# Patient Record
Sex: Male | Born: 1955 | Race: White | Hispanic: No | Marital: Married | Smoking: Current some day smoker
Health system: Southern US, Community
[De-identification: ages and names within clinical notes are randomized; demographics above are authoritative.]

## PROBLEM LIST (undated history)

## (undated) DIAGNOSIS — C3490 Malignant neoplasm of unspecified part of unspecified bronchus or lung: Secondary | ICD-10-CM

## (undated) DIAGNOSIS — Z8701 Personal history of pneumonia (recurrent): Secondary | ICD-10-CM

## (undated) DIAGNOSIS — J90 Pleural effusion, not elsewhere classified: Secondary | ICD-10-CM

## (undated) DIAGNOSIS — J449 Chronic obstructive pulmonary disease, unspecified: Secondary | ICD-10-CM

## (undated) DIAGNOSIS — Z8709 Personal history of other diseases of the respiratory system: Secondary | ICD-10-CM

## (undated) DIAGNOSIS — G8929 Other chronic pain: Secondary | ICD-10-CM

## (undated) DIAGNOSIS — Z902 Acquired absence of lung [part of]: Secondary | ICD-10-CM

## (undated) DIAGNOSIS — I3139 Other pericardial effusion (noninflammatory): Secondary | ICD-10-CM

## (undated) DIAGNOSIS — F419 Anxiety disorder, unspecified: Secondary | ICD-10-CM

## (undated) DIAGNOSIS — I313 Pericardial effusion (noninflammatory): Secondary | ICD-10-CM

## (undated) HISTORY — DX: Personal history of other diseases of the respiratory system: Z87.09

## (undated) HISTORY — DX: Personal history of pneumonia (recurrent): Z87.01

## (undated) HISTORY — DX: Pericardial effusion (noninflammatory): I31.3

## (undated) HISTORY — DX: Pleural effusion, not elsewhere classified: J90

## (undated) HISTORY — DX: Other pericardial effusion (noninflammatory): I31.39

## (undated) HISTORY — DX: Other chronic pain: G89.29

## (undated) HISTORY — DX: Anxiety disorder, unspecified: F41.9

## (undated) HISTORY — PX: OTHER SURGICAL HISTORY: SHX169

---

## 1994-01-25 HISTORY — PX: OTHER SURGICAL HISTORY: SHX169

## 1995-01-26 DIAGNOSIS — M5030 Other cervical disc degeneration, unspecified cervical region: Secondary | ICD-10-CM | POA: Insufficient documentation

## 1995-01-26 HISTORY — PX: OTHER SURGICAL HISTORY: SHX169

## 1998-01-04 ENCOUNTER — Emergency Department (HOSPITAL_COMMUNITY): Admission: EM | Admit: 1998-01-04 | Discharge: 1998-01-04 | Payer: Self-pay | Admitting: Emergency Medicine

## 1998-07-17 ENCOUNTER — Emergency Department (HOSPITAL_COMMUNITY): Admission: EM | Admit: 1998-07-17 | Discharge: 1998-07-17 | Payer: Self-pay

## 1998-08-31 ENCOUNTER — Encounter: Payer: Self-pay | Admitting: Emergency Medicine

## 1998-08-31 ENCOUNTER — Emergency Department (HOSPITAL_COMMUNITY): Admission: EM | Admit: 1998-08-31 | Discharge: 1998-08-31 | Payer: Self-pay | Admitting: Emergency Medicine

## 1998-09-06 ENCOUNTER — Emergency Department (HOSPITAL_COMMUNITY): Admission: EM | Admit: 1998-09-06 | Discharge: 1998-09-06 | Payer: Self-pay | Admitting: Emergency Medicine

## 1999-02-26 ENCOUNTER — Emergency Department (HOSPITAL_COMMUNITY): Admission: EM | Admit: 1999-02-26 | Discharge: 1999-02-26 | Payer: Self-pay | Admitting: Emergency Medicine

## 1999-02-26 ENCOUNTER — Encounter: Payer: Self-pay | Admitting: Emergency Medicine

## 1999-03-04 ENCOUNTER — Emergency Department (HOSPITAL_COMMUNITY): Admission: EM | Admit: 1999-03-04 | Discharge: 1999-03-04 | Payer: Self-pay | Admitting: Emergency Medicine

## 1999-03-04 ENCOUNTER — Encounter: Payer: Self-pay | Admitting: Emergency Medicine

## 2001-06-21 ENCOUNTER — Emergency Department (HOSPITAL_COMMUNITY): Admission: EM | Admit: 2001-06-21 | Discharge: 2001-06-22 | Payer: Self-pay | Admitting: Emergency Medicine

## 2001-06-22 ENCOUNTER — Encounter: Payer: Self-pay | Admitting: Emergency Medicine

## 2001-07-12 ENCOUNTER — Ambulatory Visit (HOSPITAL_BASED_OUTPATIENT_CLINIC_OR_DEPARTMENT_OTHER): Admission: RE | Admit: 2001-07-12 | Discharge: 2001-07-12 | Payer: Self-pay | Admitting: Orthopedic Surgery

## 2002-06-26 ENCOUNTER — Emergency Department (HOSPITAL_COMMUNITY): Admission: EM | Admit: 2002-06-26 | Discharge: 2002-06-27 | Payer: Self-pay | Admitting: Emergency Medicine

## 2002-06-26 ENCOUNTER — Encounter: Payer: Self-pay | Admitting: Emergency Medicine

## 2003-09-19 ENCOUNTER — Ambulatory Visit (HOSPITAL_COMMUNITY): Admission: RE | Admit: 2003-09-19 | Discharge: 2003-09-19 | Payer: Self-pay | Admitting: Internal Medicine

## 2003-10-20 ENCOUNTER — Ambulatory Visit (HOSPITAL_COMMUNITY): Admission: RE | Admit: 2003-10-20 | Discharge: 2003-10-20 | Payer: Self-pay

## 2005-10-18 ENCOUNTER — Emergency Department (HOSPITAL_COMMUNITY): Admission: EM | Admit: 2005-10-18 | Discharge: 2005-10-18 | Payer: Self-pay | Admitting: Emergency Medicine

## 2006-04-22 DIAGNOSIS — G5620 Lesion of ulnar nerve, unspecified upper limb: Secondary | ICD-10-CM

## 2006-04-22 DIAGNOSIS — G5600 Carpal tunnel syndrome, unspecified upper limb: Secondary | ICD-10-CM

## 2006-05-03 ENCOUNTER — Emergency Department (HOSPITAL_COMMUNITY): Admission: EM | Admit: 2006-05-03 | Discharge: 2006-05-04 | Payer: Self-pay | Admitting: Emergency Medicine

## 2006-05-04 DIAGNOSIS — E278 Other specified disorders of adrenal gland: Secondary | ICD-10-CM | POA: Insufficient documentation

## 2006-06-01 ENCOUNTER — Encounter: Admission: RE | Admit: 2006-06-01 | Discharge: 2006-06-01 | Payer: Self-pay | Admitting: Orthopaedic Surgery

## 2006-07-05 ENCOUNTER — Ambulatory Visit: Payer: Self-pay | Admitting: Internal Medicine

## 2006-07-12 ENCOUNTER — Ambulatory Visit (HOSPITAL_COMMUNITY): Admission: RE | Admit: 2006-07-12 | Discharge: 2006-07-12 | Payer: Self-pay | Admitting: Orthopaedic Surgery

## 2006-08-04 ENCOUNTER — Ambulatory Visit (HOSPITAL_COMMUNITY): Admission: RE | Admit: 2006-08-04 | Discharge: 2006-08-04 | Payer: Self-pay | Admitting: Internal Medicine

## 2006-08-04 DIAGNOSIS — J4489 Other specified chronic obstructive pulmonary disease: Secondary | ICD-10-CM | POA: Insufficient documentation

## 2006-08-04 DIAGNOSIS — J449 Chronic obstructive pulmonary disease, unspecified: Secondary | ICD-10-CM

## 2006-08-09 ENCOUNTER — Ambulatory Visit: Payer: Self-pay | Admitting: Internal Medicine

## 2006-08-11 ENCOUNTER — Ambulatory Visit (HOSPITAL_COMMUNITY): Admission: RE | Admit: 2006-08-11 | Discharge: 2006-08-11 | Payer: Self-pay | Admitting: Internal Medicine

## 2006-08-11 DIAGNOSIS — F17200 Nicotine dependence, unspecified, uncomplicated: Secondary | ICD-10-CM

## 2006-08-11 DIAGNOSIS — M545 Low back pain: Secondary | ICD-10-CM

## 2006-08-12 ENCOUNTER — Ambulatory Visit: Payer: Self-pay | Admitting: Internal Medicine

## 2006-08-12 LAB — CONVERTED CEMR LAB
Catecholamines Tot(E+NE) 24 Hr U: 0.043 mg/24hr
Creatinine 24 HR UR: 1635 mg/24hr (ref 800–2000)
Creatinine, Urine: 113.5 mg/dL
Dopamine 24 Hr Urine: 187 mcg/24hr (ref <500)
Epinephrine 24 Hr Urine: 4 mcg/24hr (ref <20)
Norepinephrine 24 Hr Urine: 39 mcg/24hr (ref <80)

## 2006-08-16 ENCOUNTER — Ambulatory Visit: Payer: Self-pay | Admitting: Thoracic Surgery (Cardiothoracic Vascular Surgery)

## 2006-08-25 ENCOUNTER — Encounter: Payer: Self-pay | Admitting: Thoracic Surgery (Cardiothoracic Vascular Surgery)

## 2006-08-25 ENCOUNTER — Inpatient Hospital Stay (HOSPITAL_COMMUNITY)
Admission: RE | Admit: 2006-08-25 | Discharge: 2006-08-31 | Payer: Self-pay | Admitting: Thoracic Surgery (Cardiothoracic Vascular Surgery)

## 2006-08-25 ENCOUNTER — Ambulatory Visit: Payer: Self-pay | Admitting: Thoracic Surgery (Cardiothoracic Vascular Surgery)

## 2006-08-25 HISTORY — PX: OTHER SURGICAL HISTORY: SHX169

## 2006-09-06 ENCOUNTER — Ambulatory Visit: Payer: Self-pay | Admitting: Thoracic Surgery (Cardiothoracic Vascular Surgery)

## 2006-09-09 ENCOUNTER — Encounter (INDEPENDENT_AMBULATORY_CARE_PROVIDER_SITE_OTHER): Payer: Self-pay | Admitting: Internal Medicine

## 2006-09-09 LAB — CONVERTED CEMR LAB
Metanephrines, Ur: 149 (ref 26–230)
Normetanephrine, 24H Ur: 273 (ref 44–540)

## 2006-09-22 ENCOUNTER — Telehealth (INDEPENDENT_AMBULATORY_CARE_PROVIDER_SITE_OTHER): Payer: Self-pay | Admitting: Internal Medicine

## 2006-09-22 ENCOUNTER — Ambulatory Visit: Payer: Self-pay | Admitting: Internal Medicine

## 2006-09-29 ENCOUNTER — Encounter
Admission: RE | Admit: 2006-09-29 | Discharge: 2006-09-29 | Payer: Self-pay | Admitting: Thoracic Surgery (Cardiothoracic Vascular Surgery)

## 2006-09-29 ENCOUNTER — Ambulatory Visit: Payer: Self-pay | Admitting: Thoracic Surgery (Cardiothoracic Vascular Surgery)

## 2006-10-18 ENCOUNTER — Ambulatory Visit: Payer: Self-pay | Admitting: Internal Medicine

## 2006-10-18 DIAGNOSIS — R1011 Right upper quadrant pain: Secondary | ICD-10-CM | POA: Insufficient documentation

## 2006-11-22 ENCOUNTER — Ambulatory Visit: Payer: Self-pay | Admitting: Internal Medicine

## 2006-11-22 DIAGNOSIS — K5900 Constipation, unspecified: Secondary | ICD-10-CM | POA: Insufficient documentation

## 2006-11-22 DIAGNOSIS — R071 Chest pain on breathing: Secondary | ICD-10-CM

## 2006-12-27 ENCOUNTER — Encounter
Admission: RE | Admit: 2006-12-27 | Discharge: 2006-12-27 | Payer: Self-pay | Admitting: Thoracic Surgery (Cardiothoracic Vascular Surgery)

## 2006-12-27 ENCOUNTER — Ambulatory Visit: Payer: Self-pay | Admitting: Thoracic Surgery (Cardiothoracic Vascular Surgery)

## 2007-02-08 ENCOUNTER — Encounter (INDEPENDENT_AMBULATORY_CARE_PROVIDER_SITE_OTHER): Payer: Self-pay | Admitting: Internal Medicine

## 2007-02-28 ENCOUNTER — Ambulatory Visit: Payer: Self-pay | Admitting: Internal Medicine

## 2007-02-28 DIAGNOSIS — J069 Acute upper respiratory infection, unspecified: Secondary | ICD-10-CM | POA: Insufficient documentation

## 2007-03-20 ENCOUNTER — Ambulatory Visit: Payer: Self-pay | Admitting: Thoracic Surgery (Cardiothoracic Vascular Surgery)

## 2007-03-20 ENCOUNTER — Encounter
Admission: RE | Admit: 2007-03-20 | Discharge: 2007-03-20 | Payer: Self-pay | Admitting: Thoracic Surgery (Cardiothoracic Vascular Surgery)

## 2007-05-25 ENCOUNTER — Ambulatory Visit: Payer: Self-pay | Admitting: Internal Medicine

## 2007-05-25 ENCOUNTER — Inpatient Hospital Stay (HOSPITAL_COMMUNITY): Admission: EM | Admit: 2007-05-25 | Discharge: 2007-05-26 | Payer: Self-pay | Admitting: Emergency Medicine

## 2007-05-25 ENCOUNTER — Encounter: Payer: Self-pay | Admitting: Internal Medicine

## 2007-05-26 ENCOUNTER — Encounter (INDEPENDENT_AMBULATORY_CARE_PROVIDER_SITE_OTHER): Payer: Self-pay | Admitting: Internal Medicine

## 2007-05-30 ENCOUNTER — Telehealth (INDEPENDENT_AMBULATORY_CARE_PROVIDER_SITE_OTHER): Payer: Self-pay | Admitting: Internal Medicine

## 2007-05-30 ENCOUNTER — Inpatient Hospital Stay (HOSPITAL_COMMUNITY): Admission: EM | Admit: 2007-05-30 | Discharge: 2007-06-05 | Payer: Self-pay | Admitting: Emergency Medicine

## 2007-05-30 ENCOUNTER — Ambulatory Visit: Payer: Self-pay | Admitting: Internal Medicine

## 2007-05-30 ENCOUNTER — Ambulatory Visit: Payer: Self-pay | Admitting: Cardiology

## 2007-05-30 DIAGNOSIS — J189 Pneumonia, unspecified organism: Secondary | ICD-10-CM

## 2007-05-30 DIAGNOSIS — R1013 Epigastric pain: Secondary | ICD-10-CM | POA: Insufficient documentation

## 2007-05-31 ENCOUNTER — Ambulatory Visit: Payer: Self-pay | Admitting: Vascular Surgery

## 2007-05-31 ENCOUNTER — Encounter (INDEPENDENT_AMBULATORY_CARE_PROVIDER_SITE_OTHER): Payer: Self-pay | Admitting: *Deleted

## 2007-05-31 ENCOUNTER — Encounter (INDEPENDENT_AMBULATORY_CARE_PROVIDER_SITE_OTHER): Payer: Self-pay | Admitting: Internal Medicine

## 2007-06-09 ENCOUNTER — Ambulatory Visit: Payer: Self-pay | Admitting: Internal Medicine

## 2007-06-09 DIAGNOSIS — N401 Enlarged prostate with lower urinary tract symptoms: Secondary | ICD-10-CM | POA: Insufficient documentation

## 2007-06-09 DIAGNOSIS — R945 Abnormal results of liver function studies: Secondary | ICD-10-CM | POA: Insufficient documentation

## 2007-06-12 LAB — CONVERTED CEMR LAB
AST: 43 units/L — ABNORMAL HIGH (ref 0–37)
Alkaline Phosphatase: 134 units/L — ABNORMAL HIGH (ref 39–117)
BUN: 21 mg/dL (ref 6–23)
Calcium: 8.6 mg/dL (ref 8.4–10.5)
Chloride: 105 meq/L (ref 96–112)
Creatinine, Ser: 1.3 mg/dL (ref 0.40–1.50)

## 2007-06-13 ENCOUNTER — Telehealth (INDEPENDENT_AMBULATORY_CARE_PROVIDER_SITE_OTHER): Payer: Self-pay | Admitting: Internal Medicine

## 2007-06-27 ENCOUNTER — Ambulatory Visit: Payer: Self-pay | Admitting: Internal Medicine

## 2007-06-30 LAB — CONVERTED CEMR LAB
Alkaline Phosphatase: 94 units/L (ref 39–117)
Bilirubin, Direct: 0.1 mg/dL (ref 0.0–0.3)
Indirect Bilirubin: 0.2 mg/dL (ref 0.0–0.9)
Total Protein: 7.5 g/dL (ref 6.0–8.3)

## 2007-07-05 ENCOUNTER — Encounter: Admission: RE | Admit: 2007-07-05 | Discharge: 2007-07-05 | Payer: Self-pay | Admitting: Orthopaedic Surgery

## 2007-07-21 ENCOUNTER — Ambulatory Visit: Payer: Self-pay | Admitting: Thoracic Surgery (Cardiothoracic Vascular Surgery)

## 2007-07-31 ENCOUNTER — Ambulatory Visit (HOSPITAL_COMMUNITY): Admission: RE | Admit: 2007-07-31 | Discharge: 2007-07-31 | Payer: Self-pay | Admitting: Family Medicine

## 2007-07-31 ENCOUNTER — Ambulatory Visit: Payer: Self-pay | Admitting: Family Medicine

## 2007-08-01 ENCOUNTER — Telehealth (INDEPENDENT_AMBULATORY_CARE_PROVIDER_SITE_OTHER): Payer: Self-pay | Admitting: Internal Medicine

## 2007-08-18 ENCOUNTER — Telehealth (INDEPENDENT_AMBULATORY_CARE_PROVIDER_SITE_OTHER): Payer: Self-pay | Admitting: Internal Medicine

## 2007-08-25 ENCOUNTER — Ambulatory Visit (HOSPITAL_COMMUNITY)
Admission: RE | Admit: 2007-08-25 | Discharge: 2007-08-25 | Payer: Self-pay | Admitting: Thoracic Surgery (Cardiothoracic Vascular Surgery)

## 2007-08-28 ENCOUNTER — Telehealth (INDEPENDENT_AMBULATORY_CARE_PROVIDER_SITE_OTHER): Payer: Self-pay | Admitting: Internal Medicine

## 2007-08-28 ENCOUNTER — Ambulatory Visit: Payer: Self-pay | Admitting: Thoracic Surgery (Cardiothoracic Vascular Surgery)

## 2007-08-31 ENCOUNTER — Encounter (INDEPENDENT_AMBULATORY_CARE_PROVIDER_SITE_OTHER): Payer: Self-pay | Admitting: Internal Medicine

## 2007-09-05 ENCOUNTER — Telehealth (INDEPENDENT_AMBULATORY_CARE_PROVIDER_SITE_OTHER): Payer: Self-pay | Admitting: Internal Medicine

## 2007-09-11 ENCOUNTER — Encounter (INDEPENDENT_AMBULATORY_CARE_PROVIDER_SITE_OTHER): Payer: Self-pay | Admitting: Internal Medicine

## 2007-09-19 ENCOUNTER — Encounter (INDEPENDENT_AMBULATORY_CARE_PROVIDER_SITE_OTHER): Payer: Self-pay | Admitting: Internal Medicine

## 2007-09-26 ENCOUNTER — Telehealth (INDEPENDENT_AMBULATORY_CARE_PROVIDER_SITE_OTHER): Payer: Self-pay | Admitting: Internal Medicine

## 2007-10-03 ENCOUNTER — Encounter (INDEPENDENT_AMBULATORY_CARE_PROVIDER_SITE_OTHER): Payer: Self-pay | Admitting: Internal Medicine

## 2007-10-19 ENCOUNTER — Telehealth (INDEPENDENT_AMBULATORY_CARE_PROVIDER_SITE_OTHER): Payer: Self-pay | Admitting: *Deleted

## 2007-10-31 ENCOUNTER — Ambulatory Visit: Payer: Self-pay | Admitting: Internal Medicine

## 2007-10-31 DIAGNOSIS — M5412 Radiculopathy, cervical region: Secondary | ICD-10-CM | POA: Insufficient documentation

## 2007-11-14 ENCOUNTER — Telehealth (INDEPENDENT_AMBULATORY_CARE_PROVIDER_SITE_OTHER): Payer: Self-pay | Admitting: Internal Medicine

## 2007-11-14 ENCOUNTER — Ambulatory Visit: Payer: Self-pay | Admitting: Internal Medicine

## 2007-11-14 DIAGNOSIS — J209 Acute bronchitis, unspecified: Secondary | ICD-10-CM

## 2007-11-15 ENCOUNTER — Encounter (INDEPENDENT_AMBULATORY_CARE_PROVIDER_SITE_OTHER): Payer: Self-pay | Admitting: Internal Medicine

## 2007-11-17 ENCOUNTER — Ambulatory Visit: Payer: Self-pay | Admitting: Nurse Practitioner

## 2007-11-17 DIAGNOSIS — J441 Chronic obstructive pulmonary disease with (acute) exacerbation: Secondary | ICD-10-CM | POA: Insufficient documentation

## 2007-11-28 ENCOUNTER — Ambulatory Visit: Payer: Self-pay | Admitting: Internal Medicine

## 2007-12-08 ENCOUNTER — Ambulatory Visit: Payer: Self-pay | Admitting: Thoracic Surgery (Cardiothoracic Vascular Surgery)

## 2007-12-08 ENCOUNTER — Encounter
Admission: RE | Admit: 2007-12-08 | Discharge: 2007-12-08 | Payer: Self-pay | Admitting: Thoracic Surgery (Cardiothoracic Vascular Surgery)

## 2008-01-11 ENCOUNTER — Telehealth (INDEPENDENT_AMBULATORY_CARE_PROVIDER_SITE_OTHER): Payer: Self-pay | Admitting: Internal Medicine

## 2008-01-12 ENCOUNTER — Ambulatory Visit: Payer: Self-pay | Admitting: Internal Medicine

## 2008-02-07 ENCOUNTER — Encounter (INDEPENDENT_AMBULATORY_CARE_PROVIDER_SITE_OTHER): Payer: Self-pay | Admitting: Internal Medicine

## 2008-02-20 ENCOUNTER — Encounter (INDEPENDENT_AMBULATORY_CARE_PROVIDER_SITE_OTHER): Payer: Self-pay | Admitting: Internal Medicine

## 2008-03-04 ENCOUNTER — Encounter (INDEPENDENT_AMBULATORY_CARE_PROVIDER_SITE_OTHER): Payer: Self-pay | Admitting: Internal Medicine

## 2008-03-12 ENCOUNTER — Encounter
Admission: RE | Admit: 2008-03-12 | Discharge: 2008-03-12 | Payer: Self-pay | Admitting: Thoracic Surgery (Cardiothoracic Vascular Surgery)

## 2008-03-12 ENCOUNTER — Ambulatory Visit: Payer: Self-pay | Admitting: Thoracic Surgery (Cardiothoracic Vascular Surgery)

## 2008-03-27 ENCOUNTER — Telehealth (INDEPENDENT_AMBULATORY_CARE_PROVIDER_SITE_OTHER): Payer: Self-pay | Admitting: Internal Medicine

## 2008-04-08 ENCOUNTER — Encounter (INDEPENDENT_AMBULATORY_CARE_PROVIDER_SITE_OTHER): Payer: Self-pay | Admitting: Internal Medicine

## 2008-04-16 ENCOUNTER — Encounter (INDEPENDENT_AMBULATORY_CARE_PROVIDER_SITE_OTHER): Payer: Self-pay | Admitting: Internal Medicine

## 2008-05-15 ENCOUNTER — Encounter (INDEPENDENT_AMBULATORY_CARE_PROVIDER_SITE_OTHER): Payer: Self-pay | Admitting: Internal Medicine

## 2008-06-27 ENCOUNTER — Encounter (INDEPENDENT_AMBULATORY_CARE_PROVIDER_SITE_OTHER): Payer: Self-pay | Admitting: Internal Medicine

## 2008-08-22 ENCOUNTER — Encounter: Admission: RE | Admit: 2008-08-22 | Discharge: 2008-08-22 | Payer: Self-pay | Admitting: *Deleted

## 2008-08-22 ENCOUNTER — Ambulatory Visit: Payer: Self-pay | Admitting: Thoracic Surgery (Cardiothoracic Vascular Surgery)

## 2008-10-12 ENCOUNTER — Encounter (INDEPENDENT_AMBULATORY_CARE_PROVIDER_SITE_OTHER): Payer: Self-pay | Admitting: Internal Medicine

## 2008-11-11 ENCOUNTER — Ambulatory Visit: Payer: Self-pay | Admitting: Physician Assistant

## 2008-11-11 DIAGNOSIS — J029 Acute pharyngitis, unspecified: Secondary | ICD-10-CM

## 2008-11-22 ENCOUNTER — Ambulatory Visit: Payer: Self-pay | Admitting: Internal Medicine

## 2008-11-22 DIAGNOSIS — Z87898 Personal history of other specified conditions: Secondary | ICD-10-CM | POA: Insufficient documentation

## 2008-12-12 ENCOUNTER — Ambulatory Visit: Payer: Self-pay | Admitting: Internal Medicine

## 2008-12-26 ENCOUNTER — Telehealth (INDEPENDENT_AMBULATORY_CARE_PROVIDER_SITE_OTHER): Payer: Self-pay | Admitting: Internal Medicine

## 2008-12-27 ENCOUNTER — Ambulatory Visit: Payer: Self-pay | Admitting: Cardiology

## 2008-12-27 ENCOUNTER — Encounter (INDEPENDENT_AMBULATORY_CARE_PROVIDER_SITE_OTHER): Payer: Self-pay | Admitting: Emergency Medicine

## 2008-12-27 ENCOUNTER — Observation Stay (HOSPITAL_COMMUNITY): Admission: EM | Admit: 2008-12-27 | Discharge: 2008-12-27 | Payer: Self-pay | Admitting: Emergency Medicine

## 2008-12-30 ENCOUNTER — Telehealth (INDEPENDENT_AMBULATORY_CARE_PROVIDER_SITE_OTHER): Payer: Self-pay | Admitting: Internal Medicine

## 2009-01-06 ENCOUNTER — Encounter (INDEPENDENT_AMBULATORY_CARE_PROVIDER_SITE_OTHER): Payer: Self-pay | Admitting: Internal Medicine

## 2009-01-06 DIAGNOSIS — E7800 Pure hypercholesterolemia, unspecified: Secondary | ICD-10-CM | POA: Insufficient documentation

## 2009-01-06 LAB — CONVERTED CEMR LAB
BUN: 16 mg/dL (ref 6–23)
Basophils Relative: 0 % (ref 0–1)
CO2: 20 meq/L (ref 19–32)
Calcium: 8.5 mg/dL (ref 8.4–10.5)
Cholesterol: 208 mg/dL — ABNORMAL HIGH (ref 0–200)
Creatinine, Ser: 1.09 mg/dL (ref 0.40–1.50)
Eosinophils Absolute: 0.2 10*3/uL (ref 0.0–0.7)
Eosinophils Relative: 2 % (ref 0–5)
Glucose, Bld: 99 mg/dL (ref 70–99)
HCT: 40.8 % (ref 39.0–52.0)
HDL: 48 mg/dL (ref >39)
MCHC: 32.1 g/dL (ref 30.0–36.0)
MCV: 104.3 fL — ABNORMAL HIGH (ref 78.0–100.0)
Monocytes Absolute: 1.1 10*3/uL — ABNORMAL HIGH (ref 0.1–1.0)
Monocytes Relative: 9 % (ref 3–12)
Neutrophils Relative %: 71 % (ref 43–77)
RBC: 3.91 M/uL — ABNORMAL LOW (ref 4.22–5.81)
Total Bilirubin: 0.4 mg/dL (ref 0.3–1.2)
Total CHOL/HDL Ratio: 4.3
Triglycerides: 98 mg/dL (ref <150)
VLDL: 20 mg/dL (ref 0–40)

## 2009-01-08 ENCOUNTER — Encounter (INDEPENDENT_AMBULATORY_CARE_PROVIDER_SITE_OTHER): Payer: Self-pay | Admitting: Internal Medicine

## 2009-01-21 ENCOUNTER — Telehealth (INDEPENDENT_AMBULATORY_CARE_PROVIDER_SITE_OTHER): Payer: Self-pay | Admitting: Internal Medicine

## 2009-01-22 ENCOUNTER — Ambulatory Visit: Payer: Self-pay | Admitting: Internal Medicine

## 2009-01-22 DIAGNOSIS — I319 Disease of pericardium, unspecified: Secondary | ICD-10-CM | POA: Insufficient documentation

## 2009-01-22 DIAGNOSIS — J90 Pleural effusion, not elsewhere classified: Secondary | ICD-10-CM | POA: Insufficient documentation

## 2009-01-22 DIAGNOSIS — G4700 Insomnia, unspecified: Secondary | ICD-10-CM | POA: Insufficient documentation

## 2009-02-05 ENCOUNTER — Ambulatory Visit (HOSPITAL_COMMUNITY): Admission: RE | Admit: 2009-02-05 | Discharge: 2009-02-05 | Payer: Self-pay | Admitting: Internal Medicine

## 2009-02-07 ENCOUNTER — Telehealth (INDEPENDENT_AMBULATORY_CARE_PROVIDER_SITE_OTHER): Payer: Self-pay | Admitting: Internal Medicine

## 2009-02-24 ENCOUNTER — Encounter
Admission: RE | Admit: 2009-02-24 | Discharge: 2009-02-24 | Payer: Self-pay | Admitting: Thoracic Surgery (Cardiothoracic Vascular Surgery)

## 2009-02-24 ENCOUNTER — Ambulatory Visit: Payer: Self-pay | Admitting: Thoracic Surgery (Cardiothoracic Vascular Surgery)

## 2009-03-12 ENCOUNTER — Encounter (INDEPENDENT_AMBULATORY_CARE_PROVIDER_SITE_OTHER): Payer: Self-pay | Admitting: Internal Medicine

## 2009-04-23 IMAGING — CR DG CHEST 1V PORT
1 series · 1 of 1 positions shown · non-contrast
Comparison: Portable chest 05/30/2007 at 3710 hours.

CLINICAL DATA: Status post intubation.

PORTABLE CHEST - 1 VIEW

[view not recorded]
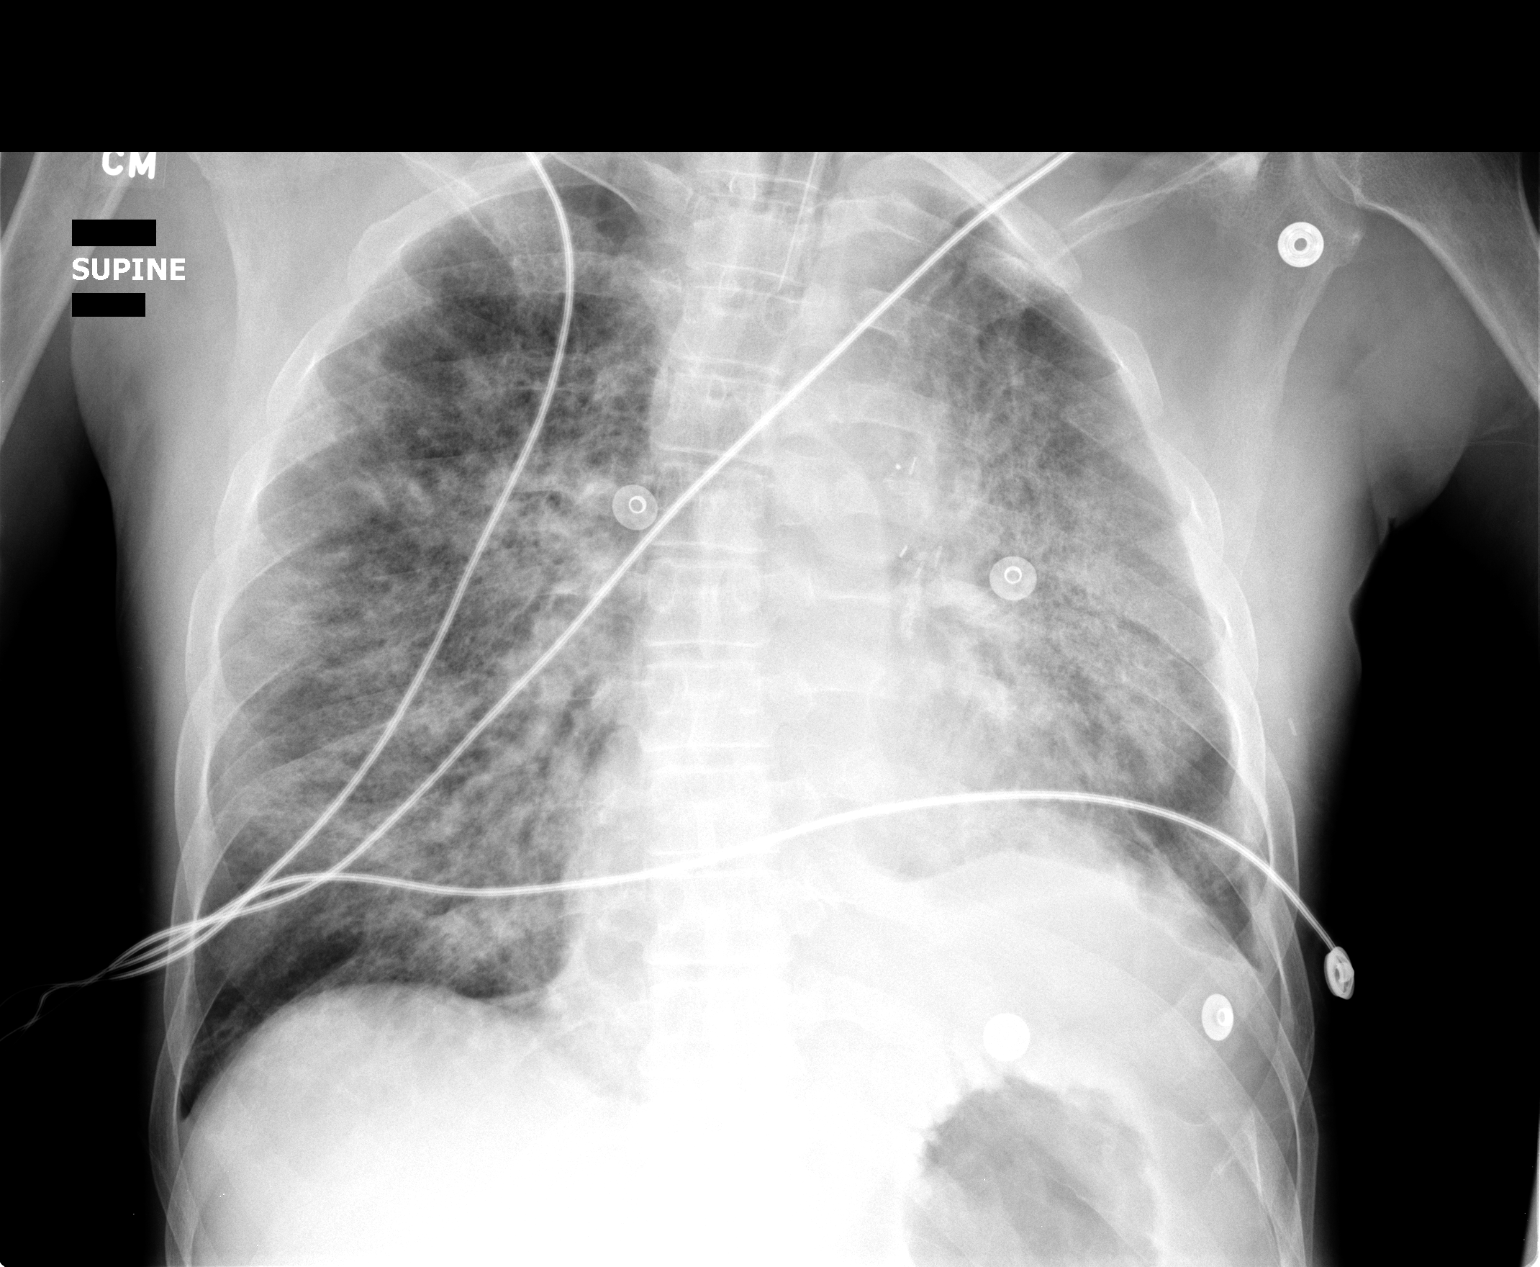

[1 of 1 positions shown; findings below may reference images not displayed]

FINDINGS: Patient has a new endotracheal tube with the tip in good
position at the level of the clavicular heads.  Extensive bilateral
airspace disease appears unchanged.  Likely a left pleural effusion
with a tiny amount of pleural fluid on the right.  Heart size upper
normal.
IMPRESSION: 1.  ET tube in good position.
2.  No marked change and airspace disease with small bilateral
pleural effusions.

## 2009-04-24 ENCOUNTER — Telehealth (INDEPENDENT_AMBULATORY_CARE_PROVIDER_SITE_OTHER): Payer: Self-pay | Admitting: Internal Medicine

## 2009-05-19 ENCOUNTER — Telehealth (INDEPENDENT_AMBULATORY_CARE_PROVIDER_SITE_OTHER): Payer: Self-pay | Admitting: *Deleted

## 2009-08-02 ENCOUNTER — Encounter (INDEPENDENT_AMBULATORY_CARE_PROVIDER_SITE_OTHER): Payer: Self-pay | Admitting: Internal Medicine

## 2009-08-05 ENCOUNTER — Ambulatory Visit: Payer: Self-pay | Admitting: Internal Medicine

## 2009-08-05 ENCOUNTER — Telehealth (INDEPENDENT_AMBULATORY_CARE_PROVIDER_SITE_OTHER): Payer: Self-pay | Admitting: Internal Medicine

## 2009-08-05 DIAGNOSIS — F329 Major depressive disorder, single episode, unspecified: Secondary | ICD-10-CM

## 2009-08-05 DIAGNOSIS — R634 Abnormal weight loss: Secondary | ICD-10-CM

## 2009-08-21 DIAGNOSIS — D539 Nutritional anemia, unspecified: Secondary | ICD-10-CM | POA: Insufficient documentation

## 2009-08-21 LAB — CONVERTED CEMR LAB
ALT: 13 units/L (ref 0–53)
AST: 14 units/L (ref 0–37)
Alkaline Phosphatase: 84 units/L (ref 39–117)
BUN: 17 mg/dL (ref 6–23)
Basophils Absolute: 0 10*3/uL (ref 0.0–0.1)
Basophils Relative: 0 % (ref 0–1)
Calcium: 9.4 mg/dL (ref 8.4–10.5)
Chloride: 107 meq/L (ref 96–112)
Creatinine, Ser: 0.88 mg/dL (ref 0.40–1.50)
Eosinophils Absolute: 0.1 10*3/uL (ref 0.0–0.7)
MCHC: 32.9 g/dL (ref 30.0–36.0)
MCV: 100.5 fL — ABNORMAL HIGH (ref 78.0–100.0)
Monocytes Absolute: 0.5 10*3/uL (ref 0.1–1.0)
Neutro Abs: 4.9 10*3/uL (ref 1.7–7.7)
Neutrophils Relative %: 65 % (ref 43–77)
Potassium: 4.2 meq/L (ref 3.5–5.3)
RDW: 15.8 % — ABNORMAL HIGH (ref 11.5–15.5)

## 2009-08-25 ENCOUNTER — Ambulatory Visit: Payer: Self-pay | Admitting: Internal Medicine

## 2009-08-25 LAB — CONVERTED CEMR LAB: RBC Folate: 939 ng/mL — ABNORMAL HIGH (ref 180–600)

## 2009-09-02 ENCOUNTER — Telehealth (INDEPENDENT_AMBULATORY_CARE_PROVIDER_SITE_OTHER): Payer: Self-pay | Admitting: Internal Medicine

## 2009-09-11 ENCOUNTER — Ambulatory Visit: Payer: Self-pay | Admitting: Internal Medicine

## 2009-09-16 ENCOUNTER — Encounter
Admission: RE | Admit: 2009-09-16 | Discharge: 2009-09-16 | Payer: Self-pay | Admitting: Thoracic Surgery (Cardiothoracic Vascular Surgery)

## 2009-09-16 ENCOUNTER — Ambulatory Visit: Payer: Self-pay | Admitting: Thoracic Surgery (Cardiothoracic Vascular Surgery)

## 2009-10-22 ENCOUNTER — Telehealth (INDEPENDENT_AMBULATORY_CARE_PROVIDER_SITE_OTHER): Payer: Self-pay | Admitting: Internal Medicine

## 2009-10-27 ENCOUNTER — Ambulatory Visit: Payer: Self-pay | Admitting: Internal Medicine

## 2009-10-31 ENCOUNTER — Telehealth (INDEPENDENT_AMBULATORY_CARE_PROVIDER_SITE_OTHER): Payer: Self-pay | Admitting: Internal Medicine

## 2009-10-31 ENCOUNTER — Ambulatory Visit: Payer: Self-pay | Admitting: Internal Medicine

## 2009-10-31 ENCOUNTER — Ambulatory Visit (HOSPITAL_COMMUNITY)
Admission: RE | Admit: 2009-10-31 | Discharge: 2009-10-31 | Payer: Self-pay | Source: Home / Self Care | Admitting: Internal Medicine

## 2009-10-31 DIAGNOSIS — R42 Dizziness and giddiness: Secondary | ICD-10-CM | POA: Insufficient documentation

## 2009-11-19 ENCOUNTER — Encounter (INDEPENDENT_AMBULATORY_CARE_PROVIDER_SITE_OTHER): Payer: Self-pay | Admitting: Internal Medicine

## 2009-12-01 ENCOUNTER — Encounter (INDEPENDENT_AMBULATORY_CARE_PROVIDER_SITE_OTHER): Payer: Self-pay | Admitting: Internal Medicine

## 2009-12-04 ENCOUNTER — Encounter (INDEPENDENT_AMBULATORY_CARE_PROVIDER_SITE_OTHER): Payer: Self-pay | Admitting: Internal Medicine

## 2009-12-11 ENCOUNTER — Encounter (INDEPENDENT_AMBULATORY_CARE_PROVIDER_SITE_OTHER): Payer: Self-pay | Admitting: Internal Medicine

## 2009-12-30 ENCOUNTER — Ambulatory Visit: Payer: Self-pay | Admitting: Internal Medicine

## 2010-01-05 ENCOUNTER — Ambulatory Visit: Payer: Self-pay | Admitting: Internal Medicine

## 2010-01-20 ENCOUNTER — Telehealth (INDEPENDENT_AMBULATORY_CARE_PROVIDER_SITE_OTHER): Payer: Self-pay | Admitting: Internal Medicine

## 2010-01-27 ENCOUNTER — Ambulatory Visit
Admission: RE | Admit: 2010-01-27 | Discharge: 2010-01-27 | Payer: Self-pay | Source: Home / Self Care | Attending: Internal Medicine | Admitting: Internal Medicine

## 2010-01-27 ENCOUNTER — Encounter (INDEPENDENT_AMBULATORY_CARE_PROVIDER_SITE_OTHER): Payer: Self-pay | Admitting: Internal Medicine

## 2010-01-27 DIAGNOSIS — R197 Diarrhea, unspecified: Secondary | ICD-10-CM | POA: Insufficient documentation

## 2010-01-28 ENCOUNTER — Encounter (INDEPENDENT_AMBULATORY_CARE_PROVIDER_SITE_OTHER): Payer: Self-pay | Admitting: Internal Medicine

## 2010-01-29 ENCOUNTER — Encounter (INDEPENDENT_AMBULATORY_CARE_PROVIDER_SITE_OTHER): Payer: Self-pay | Admitting: Internal Medicine

## 2010-02-04 LAB — CONVERTED CEMR LAB
AST: 14 units/L (ref 0–37)
Alkaline Phosphatase: 87 units/L (ref 39–117)
BUN: 15 mg/dL (ref 6–23)
Basophils Absolute: 0 10*3/uL (ref 0.0–0.1)
Basophils Relative: 1 % (ref 0–1)
Creatinine, Ser: 0.95 mg/dL (ref 0.40–1.50)
Eosinophils Absolute: 0.2 10*3/uL (ref 0.0–0.7)
MCHC: 34.7 g/dL (ref 30.0–36.0)
MCV: 97.9 fL (ref 78.0–100.0)
Monocytes Absolute: 0.7 10*3/uL (ref 0.1–1.0)
Monocytes Relative: 8 % (ref 3–12)
Neutrophils Relative %: 62 % (ref 43–77)
RBC: 3.83 M/uL — ABNORMAL LOW (ref 4.22–5.81)
RDW: 14.9 % (ref 11.5–15.5)

## 2010-02-14 ENCOUNTER — Encounter: Payer: Self-pay | Admitting: Internal Medicine

## 2010-02-15 ENCOUNTER — Encounter: Payer: Self-pay | Admitting: Thoracic Surgery (Cardiothoracic Vascular Surgery)

## 2010-02-15 ENCOUNTER — Encounter: Payer: Self-pay | Admitting: Internal Medicine

## 2010-02-24 NOTE — Letter (Signed)
Summary: REQUESTED RECORDS FOR SELF  REQUESTED RECORDS FOR SELF   Imported By: Arta Bruce 12/11/2009 16:35:35  _____________________________________________________________________  External Attachment:    Type:   Image     Comment:   External Document

## 2010-02-24 NOTE — Letter (Signed)
Summary: *Referral Letter  [facility]  [address] [place], [place] [address]   Phone: [phone]  Fax: [phone]    08/05/2009  Thank you in advance for agreeing to see my patient:  Joseph Torres [address] [place], [place]  [address]  Phone: [phone]  Reason for Referral: Pt. with hx. of BPH, improved on combination of Finasteride and Flomax.  Has been on this combination consistently for past 6 months.  Still having sudden episodes of urine flow interruption with need to push urine out.  When he does push with these episodes, urine sprays everywhere.  Can happen every day for several days in a row and then go several days where he is asymptomatic.  Procedures Requested: Evaluation and recommendations.  Current Medical Problems: 1)  DEPRESSION (ICD-311) 2)  WEIGHT LOSS (ICD-783.21) 3)  INSOMNIA (ICD-780.52) 4)  PERICARDIAL EFFUSION (ICD-423.9) 5)  PLEURAL EFFUSION, LEFT (ICD-511.9) 6)  HYPERCHOLESTEROLEMIA, MILD (ICD-272.0) 7)  BENIGN PROSTATIC HYPERTROPHY, MILD, HX OF (ICD-V13.8) 8)  SORE THROAT (ICD-462) 9)  CHRONIC OBSTRUCTIVE PULMONARY DISEASE, ACUTE EXACERBATION (ICD-491.21) 10)  BRONCHITIS, ACUTE WITH BRONCHOSPASM (ICD-466.0) 11)  CERVICAL RADICULOPATHY, LEFT (ICD-723.4) 12)  ABNORMAL UPTAKE IN COLON AND RECTUM--PET SCAN (ICD-793.6) 13)  CHEST PAIN, PLEURITIC (ICD-786.52) 14)  BENIGN PROSTATIC HYPERTROPHY, WITH OBSTRUCTION (ICD-600.01) 15)  LIVER FUNCTION TESTS, ABNORMAL (ICD-794.8) 16)  ABDOMINAL PAIN, EPIGASTRIC (ICD-789.06) 17)  PNEUMONIA, LEFT (ICD-486) 18)  UPPER RESPIRATORY INFECTION (ICD-465.9) 19)  CONSTIPATION (ICD-564.00) 20)  CHEST WALL PAIN, ACUTE (ICD-786.52) 21)  SYMPTOM, PAIN, ABDOMINAL, RIGHT UP QUADRANT (ICD-789.01) 22)  ADRENAL MASS, BILATERAL (ICD-255.8) 23)  COPD, MILD (ICD-496) 24)  BACK PAIN, LUMBAR, CHRONIC (ICD-724.2) 25)  ULNAR NEUROPATHY, LEFT (ICD-354.2) 26)  CARPAL TUNNEL SYNDROME, LEFT (ICD-354.0) 27)   TOBACCO ABUSE (ICD-305.1) 28)  DEGENERATIVE DISC DISEASE, CERVICAL SPINE (ICD-722.4) 29)  ADENOCARCINOMA, LUNG, UPPER LOBE, LEFT, 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)   Current Medications: 1)  ROBAXIN-750 750 MG  TABS (METHOCARBAMOL) 1 tab by mouth four times daily 2)  AMBIEN CR 12.5 MG  TBCR (ZOLPIDEM TARTRATE) 1 tab by mouth q hs as needed sleep 3)  KLONOPIN 1 MG TABS (CLONAZEPAM) 1 tab by mouth two times a day 4)  WELLBUTRIN SR 150 MG  TB12 (BUPROPION HCL) 1 tablet by mouth in the morning 5)  ALBUTEROL 90 MCG/ACT  AERS (ALBUTEROL) 2 puffs q4h as needed 6)  ALBUTEROL SULFATE (2.5 MG/3ML) 0.083% NEBU (ALBUTEROL SULFATE) nebulize qid as needed wheeze 7)  NEBULIZER  MISC (NEBULIZERS) use for nebulization prn 8)  FLUTICASONE PROPIONATE 50 MCG/ACT SUSP (FLUTICASONE PROPIONATE) 2 sprays each nostril daily 9)  FINASTERIDE 5 MG TABS (FINASTERIDE) 1 tab by mouth daily 10)  FLOMAX 0.4 MG CAPS (TAMSULOSIN HCL) 1 cap by mouth with supper daily 11)  AEROCHAMBER MV  MISC (SPACER/AERO-HOLDING CHAMBERS) Use with Ventolin MDI 12)  OPANA ER 40 MG XR12H-TAB (OXYMORPHONE HCL) 1 tab by mouth every 8 hours--pain clinic 13)  SERTRALINE HCL 50 MG TABS (SERTRALINE HCL) 1/2 tab daily for 7 days, then increase  to 1 tab by mouth daily.   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  Sincerely,  Julieanne Manson MD

## 2010-02-24 NOTE — Miscellaneous (Signed)
Summary: immunization update  Clinical Lists Changes  Observations: Added new observation of FLU VAX: Historical (10/12/2008 14:10)      Influenza Immunization History:    Influenza # 1:  Historical (10/12/2008)

## 2010-02-24 NOTE — Letter (Signed)
Summary: TEST ORDER FORM//CT WITHOUT CONTRST//CHEST//APPT DATE & TIME  TEST ORDER FORM//CT WITHOUT CONTRST//CHEST//APPT DATE & TIME   Imported By: Arta Bruce 03/12/2009 12:01:06  _____________________________________________________________________  External Attachment:    Type:   Image     Comment:   External Document

## 2010-02-24 NOTE — Progress Notes (Signed)
Summary: DIFFERENT MED NEEDED ASAP  Phone Note Refill Request   Refills Requested: Medication #1:  SERTRALINE HCL 50 MG TABS 1/2 tab daily for 7 days PT NEEDS HIGHER DOSE OR A DIFFERENT  MEDS HE SAID IT IS NOT WORKING RIGHT  [facility] [place]  Initial call taken by: Oscar La,  October 22, 2009 2:16 PM  Follow-up for Phone Call        pt states he is having a heavy load. pt lost two dogs not too long ago not happy about condition very depressed battling cancer Zoloft is not helping, at first it did well but now it is not doing well pt is willing to see Marchelle Folks please increase meds Follow-up by: Michelle Nasuti,  October 22, 2009 5:28 PM  Additional Follow-up for Phone Call Additional follow up Details #1::        Please refer to [facility] and schedule with me in next month Additional Follow-up by: Julieanne Manson MD,  October 24, 2009 12:08 AM    Additional Follow-up for Phone Call Additional follow up Details #2::    pt given appt for [facility] and fllow up Follow-up by: Michelle Nasuti,  October 24, 2009 3:06 PM

## 2010-02-24 NOTE — Medication Information (Signed)
Summary: NEBULIZER//[facility]  NEBULIZER//[facility]   Imported By: Arta Bruce 12/11/2009 11:18:54  _____________________________________________________________________  External Attachment:    Type:   Image     Comment:   External Document

## 2010-02-24 NOTE — Assessment & Plan Note (Signed)
Summary: 4 MONTH FU///KT   Vital Signs:  Patient profile:   55 year old male Weight:      135 pounds Temp:     97.5 degrees F Pulse rate:   103 / minute Pulse rhythm:   regular Resp:     20 per minute BP sitting:   120 / 87  (left arm) Cuff size:   regular  Vitals Entered By: Vesta Mixer CMA (August 05, 2009 9:57 AM) CC: 4 month f/u, is having terrible back and neck pain, does not feel like the medicine regimen he is taking is helping. Is Patient Diabetic? No Pain Assessment Patient in pain? yes     Location: neck and back Intensity: 10  Does patient need assistance? Ambulation Normal   CC:  4 month f/u, is having terrible back and neck pain, and does not feel like the medicine regimen he is taking is helping.Marland Kitchen  History of Present Illness: 1.  Chronic pain issues:  Neck and back, basically all over--working with pain clinic on injections and pain meds.  Pain meds now switched to Opana--not working very well.  Plans to go to pain clinic  today after our visit.  2.  Depression:  Elderly mother quite ill.  Lost 2 new puppies recently.  Not sleeping well.  Having difficulty initiating and staying asleep.  Sounds like he is thinging about all of his concerns in life.  Not refreshed in morning.  Does not look forward to day. He is still working on Diplomatic Services operational officer music, but taking a long time to get things done.  No suicidal ideation.  Relationship with wife is fine.  Wife is supportive.  He is interested in counseling.  Energy not good, no appetite.  Has lost close to 30 lbs since last here 6 months ago.  No melena or hematochezia.  However, pt.  did purposefully start exercising and watching what he eats after last visit when he weighed over 160  3.  Lung cancer:  Sees Dr. Edwyna Shell 7/26--sees him every 6 month.  Not sure if gets a scan.  Breathing is fairly good.  Breathing on awakening is somewhat "bubbling"--can cough up clear mucous, but thick.  Smoking up to 1 ppd now.  Interested in  starting Chantix once depression improved.  4.  BPH:  Still having problems with urine "cutting off on him"  Pt. was off Finasteride and Flomax for 2 months and back on now for 3 months.  Symptoms, however, are much better than when seen end of December when was off meds.    Allergies (verified): 1)  ! Penicillin 2)  Codeine 3)  Aspirin  Physical Exam  General:  Looks very fit and healthy. Tearful at times. Lungs:  Decreased BS througout, but clear. Heart:  Normal rate and regular rhythm. S1 and S2 normal without gallop, murmur, click, rub or other extra sounds.  Radial pulses normal and eqaul Abdomen:  Bowel sounds positive,abdomen soft and non-tender without masses, organomegaly or hernias noted.   Impression & Recommendations:  Problem # 1:  INSOMNIA (ICD-780.52) and depression Start Sertraline and decrease Welbutrin to once daily His updated medication list for this problem includes:    Ambien Cr 12.5 Mg Tbcr (Zolpidem tartrate) .Marland Kitchen... 1 tab by mouth q hs as needed sleep  Orders: Psychology Referral (Psychology)  Problem # 2:  WEIGHT LOSS (ICD-783.21) Suspect this is only secondary to good lifestyle changes, but with lung cancer hx, check labs--does have follow up with Dr. Edwyna Shell later  this month for lung cancer folllowup Orders: T-Comprehensive Metabolic Panel [phone]) T-CBC w/Diff ([id number])  Problem # 3:  BENIGN PROSTATIC HYPERTROPHY, WITH OBSTRUCTION (ICD-600.01) Still having frequent enough symptoms of sudden obstruction--will refer to Urology  Complete Medication List: 1)  Robaxin-750 750 Mg Tabs (Methocarbamol) .Marland Kitchen.. 1 tab by mouth four times daily 2)  Ambien Cr 12.5 Mg Tbcr (Zolpidem tartrate) .Marland Kitchen.. 1 tab by mouth q hs as needed sleep 3)  Klonopin 1 Mg Tabs (Clonazepam) .Marland Kitchen.. 1 tab by mouth two times a day 4)  Wellbutrin Sr 150 Mg Tb12 (Bupropion hcl) .Marland Kitchen.. 1 tablet by mouth in the morning 5)  Albuterol 90 Mcg/act Aers (Albuterol) .... 2 puffs q4h as  needed 6)  Albuterol Sulfate (2.5 Mg/69ml) 0.083% Nebu (Albuterol sulfate) .... Nebulize qid as needed wheeze 7)  Nebulizer Misc (Nebulizers) .... Use for nebulization prn 8)  Fluticasone Propionate 50 Mcg/act Susp (Fluticasone propionate) .... 2 sprays each nostril daily 9)  Finasteride 5 Mg Tabs (Finasteride) .Marland Kitchen.. 1 tab by mouth daily 10)  Flomax 0.4 Mg Caps (Tamsulosin hcl) .Marland Kitchen.. 1 cap by mouth with supper daily 11)  Aerochamber Mv Misc (Spacer/aero-holding chambers) .... Use with ventolin mdi 12)  Opana Er 40 Mg Xr12h-tab (Oxymorphone hcl) .Marland Kitchen.. 1 tab by mouth every 8 hours--pain clinic 13)  Sertraline Hcl 50 Mg Tabs (Sertraline hcl) .... 1/2 tab daily for 7 days, then increase  to 1 tab by mouth daily.  Other Orders: Urology Referral (Urology)  Patient Instructions: 1)  Follow up with Dr. Delrae Alfred in 1 month--depression 2)  Referral to [facility] for counseling. Prescriptions: WELLBUTRIN SR 150 MG  TB12 (BUPROPION HCL) 1 tablet by mouth in the morning  #30 x 11   Entered and Authorized by:   Julieanne Manson MD   Signed by:   Julieanne Manson MD on 08/05/2009   Method used:   Electronically to        [facility] [address] [phone]* (retail)       [address]       [name]       [place], [place]  [address]       Ph: [phone]       Fax: [phone]   RxID:   [id number] SERTRALINE HCL 50 MG TABS (SERTRALINE HCL) 1/2 tab daily for 7 days, then increase  to 1 tab by mouth daily.  #30 x 2   Entered and Authorized by:   Julieanne Manson MD   Signed by:   Julieanne Manson MD on 08/05/2009   Method used:   Electronically to        [facility] [address] [phone]* (retail)       [address]       [address]       [place], [place]  [address]       Ph: [phone]       Fax: [phone]   RxID:   [id number]

## 2010-02-24 NOTE — Letter (Signed)
Summary: [facility]   Imported By: Arta Bruce 09/10/2009 09:46:03  _____________________________________________________________________  External Attachment:    Type:   Image     Comment:   External Document

## 2010-02-24 NOTE — Progress Notes (Signed)
Summary: WANTS CT SCAN RESULTS  Phone Note Call from Patient Call back at Home Phone [phone]   Reason for Call: Lab or Test Results Summary of Call: Lavel Rieman PT. MR Schriever WANTS SOMEONE TO CALL HIM WITH HIS CT SCAN RESULTS. Initial call taken by: Leodis Rains,  February 07, 2009 10:42 AM  Follow-up for Phone Call        Let him know the fluid in his left lung is gone.  A bit of the fluid around the heart is still there.  I think he had pericarditis (inflammation around the heart) and it is now healing.  Most likely a virus Follow-up by: Julieanne Manson MD,  February 09, 2009 11:22 PM  Additional Follow-up for Phone Call Additional follow up Details #1::        Pt came into office and notified. Additional Follow-up by: Vesta Mixer CMA,  February 11, 2009 5:01 PM

## 2010-02-24 NOTE — Medication Information (Signed)
Summary: NEBULIZER/INHALATION DRUG FORM  NEBULIZER/INHALATION DRUG FORM   Imported By: Arta Bruce 11/19/2009 10:56:02  _____________________________________________________________________  External Attachment:    Type:   Image     Comment:   External Document

## 2010-02-24 NOTE — Progress Notes (Signed)
Summary: NEEDS NEW VENTOLION CHAMBER FROM [facility]  Phone Note Call from Patient Call back at [facility] Phone [phone]   Reason for Call: Referral Summary of Call: Joseph Torres PT. MR [name] SAYS THAT HE NEEDS YOU TO SEND IN  NEW RX FOR A NEW CHAMBER FOR HIS VENTLOIN INHALER. HIS IS VERY OLD AND DISCOLORED,  AND HE HAS SPOKEN TO [facility] , AND THEY CALL IT A AIR CODE CHAMBER.  HE SAYS THAT HE WOULD LIKE TO TRY AND GET IT BY THIS WEEKEND Initial call taken by: Leodis Rains,  April 24, 2009 10:00 AM  Follow-up for Phone Call        Fwd to Dr. Delrae Alfred for Berkley Harvey. Follow-up by: Vesta Mixer CMA,  April 24, 2009 10:08 AM  Additional Follow-up for Phone Call Additional follow up Details #1::        Please call and see where he needs the Rx to go--[facility] or to the pharmacy--I am sending to pharmacy, but please call in or copy off Rx and send into [facility] if that is the proper place Additional Follow-up by: Julieanne Manson MD,  April 28, 2009 9:09 AM    Additional Follow-up for Phone Call Additional follow up Details #2::    Left message on answering machine for pt to return call @ [phone].  [name] SMA   April 29, 2009 11:43 AM   New/Updated Medications: AEROCHAMBER MV  MISC (SPACER/AERO-HOLDING CHAMBERS) Use with Ventolin MDI Prescriptions: AEROCHAMBER MV  MISC (SPACER/AERO-HOLDING CHAMBERS) Use with Ventolin MDI  #1 x 0   Entered and Authorized by:   Julieanne Manson MD   Signed by:   Julieanne Manson MD on 04/28/2009   Method used:   Electronically to        [facility] Dr[address] [phone]* (retail)       [address]       [address]       [name], [place]  [address]       Ph: [phone]       Fax: [phone]   RxID:   [id number]

## 2010-02-24 NOTE — Letter (Signed)
Summary: REFERRAL/UROLOGY//  REFERRAL/UROLOGY//   Imported By: Arta Bruce 08/08/2009 11:45:20  _____________________________________________________________________  External Attachment:    Type:   Image     Comment:   External Document

## 2010-02-24 NOTE — Progress Notes (Signed)
Summary: DEPRESSED/ASKING FOR HELP  Phone Note Call from Patient Call back at Home Phone [phone]   Reason for Call: Talk to Doctor Summary of Call: MULBERRY PT. MR Janice CALLED AND SASY THAT HE IS DEPRESSED AND IT DOESN'T TAKE MUCH FOR HIM TO BREAK DOWN AND CRY, ESPECIALLY WITH ALL THAT GOING ON IN HIS LIFE AND ALL HIS HEALTH ISSUES. HE SAYS THAT HE KNOWS THAT HE IS DEPRESSED. MR Xu SAYS THAT A FRIEND OF HIS TOLD HIM THAT ZOLOFT IS GOOD. HE SAYS HE IS NOT TRYING TO TELL YOU WHAT TO DO.  HE USES [facility] [address] Initial call taken by: Leodis Rains,  May 19, 2009 10:26 AM  Follow-up for Phone Call        Left message on answering machine for pt to return call at [phone]. Follow-up by: Vesta Mixer CMA,  May 19, 2009 3:50 PM  Additional Follow-up for Phone Call Additional follow up Details #1::        Spoke w/pt. Notified him of his appt. tomorrow and told him Dr. Delrae Alfred will be able to answer any questions in re: to depression that he may have. ..... [name] SMA    May 22, 2009 10:46 AM

## 2010-02-24 NOTE — Progress Notes (Signed)
Summary: Neurosurgery referral  Phone Note Outgoing Call   Summary of Call: Arna Medici --referral to Neurosurgery--see order and letter. Initial call taken by: Julieanne Manson MD,  October 31, 2009 4:58 PM  Follow-up for Phone Call        PLEASE CALL PT NEED REFERRAL FOR NEUROSURGERY PHONE # [phone] Follow-up by: Domenic Polite,  November 04, 2009 2:36 PM  Additional Follow-up for Phone Call Additional follow up Details #1::        MR Blatchford WIFE CALLED CHECKING ON HIS NEIROLOGY REFERRAL. Additional Follow-up by: Leodis Rains,  November 07, 2009 2:31 PM    Additional Follow-up for Phone Call Additional follow up Details #2::    I SEND THE REFERRAL TO [facility] WAITING FOR AN APPT  Follow-up by: Cheryll Dessert,  November 07, 2009 4:52 PM

## 2010-02-24 NOTE — Progress Notes (Signed)
Summary: Pt needs lab results and bp is very high/ Please call him back  Phone Note Call from Patient Call back at [phone]   Summary of Call: The pt wanted to know the blood test and blood count the clinic have have done on him.   Today, the pt went to [facility] and the lady at there informed him that his bp was very high.   [facility] Md Initial call taken by: Manon Hilding,  September 02, 2009 9:14 AM  Follow-up for Phone Call        Lab results are back, will fwd to Dr. Delrae Alfred for review. Follow-up by: Vesta Mixer CMA,  September 02, 2009 12:24 PM  Additional Follow-up for Phone Call Additional follow up Details #1::        MR Messina CALLED AGAIN TO SEE WHAT HIS LAB RESULTS ARE. MR Mazor SAYS THAT WHEN YOU CALL, YOU WILL HAVE TO DIAL THE 336# Additional Follow-up by: Leodis Rains,  September 05, 2009 12:01 PM    Additional Follow-up for Phone Call Additional follow up Details #2::    Please let him know his labs are fine. He needs to come in and get bp checked here--nurse visit. Follow-up by: Julieanne Manson MD,  September 09, 2009 3:43 PM  Additional Follow-up for Phone Call Additional follow up Details #3:: Details for Additional Follow-up Action Taken: Pt aware and lab appt made for Thursday for bp check. Additional Follow-up by: Vesta Mixer CMA,  September 09, 2009 4:29 PM

## 2010-02-24 NOTE — Progress Notes (Signed)
Summary: Query:  Refill Sertraline?  Phone Note Outgoing Call   Summary of Call: Do you want to continue his sertraline?  How many refills? Initial call taken by: Dutch Quint RN,  October 31, 2009 12:39 PM  Follow-up for Phone Call        I already filled at his visit today Follow-up by: Julieanne Manson MD,  October 31, 2009 4:37 PM  Additional Follow-up for Phone Call Additional follow up Details #1::        Noted.  Dutch Quint RN  November 04, 2009 12:49 PM

## 2010-02-24 NOTE — Assessment & Plan Note (Signed)
Summary: HE CAN'T KEEP HES BALANCE///MC   Vital Signs:  Patient profile:   55 year old male Height:      66 inches Weight:      139 pounds BMI:     22.52 Temp:     96.8 degrees F oral Pulse rate:   100 / minute Pulse rhythm:   regular Resp:     18 per minute BP sitting:   80 / 60  (left arm) Cuff size:   regular  Vitals Entered By: [place] Shannon (October 31, 2009 9:04 AM) CC: pt says he has felt like this three days now... pt says he was unable to walk... he can not keep his balance... pt says he has fell and hurt his arm...  Does patient need assistance? Functional Status Self care Ambulation Normal   CC:  pt says he has felt like this three days now... pt says he was unable to walk... he can not keep his balance... pt says he has fell and hurt his arm....  History of Present Illness: 1.  Problems with balance for past 3 days.  Was feeling fine 4 days ago, then awakened 3 mornings ago with dizziness like the room is spinning and inability to walk without losing balance.  No associated nausea.  Has dizziness in any position, worse with any movement.  Has not noted that turning one way or another is any different with exacerbation.    Felt like he had fluid in his ear with roaring day before started--cleaned a bit of cerumen out of left ear, the sloshing noise stopped and fluid sensation stopped.  No focal weakness.  No definite focal numbness or tingling.  No congestion recently/no cold symptoms.  Did have flu vaccine last month.    2.  Depression:  Pt. had called in recently stating he was battling cancer.  Was in to see Dr. Dorris Fetch, thoracic surgeon a month or so ago.  CT of chest, however, reportedly did not show recurrence.  Pt. lost a couple of dogs --though this was before started on Zoloft.  Dealing with chronic illnesses, not bringing in money for the household.  Has been to see Aqu[facility] Solian and is following up with her for at least 8 visits.  Pt. states initially did well  with Zoloft and then just felt like it stopped working.  3.  COPD:  very bubbly with secretions.  Was on Spiriva previously, but did not feel it helped with secreations that much.  Would like something to use in nebulizer--maybe also something he could use at same time as albuterol.  Still smoking 4-5 cigarettes daily.  Current Medications (verified): 1)  Robaxin-750 750 Mg  Tabs (Methocarbamol) .Marland Kitchen.. 1 Tab By Mouth Four Times Daily 2)  Ambien Cr 12.5 Mg  Tbcr (Zolpidem Tartrate) .Marland Kitchen.. 1 Tab By Mouth Q Hs As Needed Sleep 3)  Klonopin 1 Mg Tabs (Clonazepam) .Marland Kitchen.. 1 Tab By Mouth Two Times A Day 4)  Wellbutrin Sr 150 Mg  Tb12 (Bupropion Hcl) .Marland Kitchen.. 1 Tablet By Mouth in The Morning 5)  Albuterol 90 Mcg/act  Aers (Albuterol) .... 2 Puffs Q4h As Needed 6)  Albuterol Sulfate (2.5 Mg/26ml) 0.083% Nebu (Albuterol Sulfate) .... Nebulize Qid As Needed Wheeze 7)  Nebulizer  Misc (Nebulizers) .... Use For Nebulization Prn 8)  Fluticasone Propionate 50 Mcg/act Susp (Fluticasone Propionate) .... 2 Sprays Each Nostril Daily 9)  Finasteride 5 Mg Tabs (Finasteride) .Marland Kitchen.. 1 Tab By Mouth Daily 10)  Flomax 0.4 Mg  Caps (Tamsulosin Hcl) .Marland Kitchen.. 1 Cap By Mouth With Supper Daily 11)  Aerochamber Mv  Misc (Spacer/aero-Holding Chambers) .... Use With Ventolin Mdi 12)  Opana Er 40 Mg Xr12h-Tab (Oxymorphone Hcl) .Marland Kitchen.. 1 Tab By Mouth Every 8 Hours--Pain Clinic 13)  Sertraline Hcl 50 Mg Tabs (Sertraline Hcl) .... 1/2 Tab Daily For 7 Days, Then Increase  To 1 Tab By Mouth Daily.  Allergies (verified): 1)  ! Penicillin 2)  Codeine 3)  Aspirin  Physical Exam  General:  NAD when speaking, a bit pale when symptomatic with vertigo Head:  Normocephalic and atraumatic without obvious abnormalities. No apparent alopecia or balding. Eyes:  No corneal or conjunctival inflammation noted. EOMI. Perrla. Funduscopic exam benign, without hemorrhages, exudates or papilledema. Vision grossly normal. Ears:  External ear exam shows no significant  lesions or deformities.  Otoscopic examination reveals clear canals, tympanic membranes are intact bilaterally without bulging, retraction, inflammation or discharge. Hearing is grossly normal bilaterally. Mouth:  pharynx pink and moist.   Neck:  limited movement--baseline for pt. Lungs:  Normal respiratory effort, chest expands symmetrically. Lungs are clear to auscultation, no crackles or wheezes. Heart:  Normal rate and regular rhythm. S1 and S2 normal without gallop, murmur, click, rub or other extra sounds.  Radial pulses normal and equal.  No carotid bruits Extremities:  Chronic atrophy of left arm and hand Neurologic:  Strength at baselinealert & oriented X3, cranial nerves II-XII intact, and DTRs symmetrical and normal.  Romberg positive.  past pointing on right with finger to nose to finger.  Very unsteady with gait.  No nystagmus with EOMI or with [facility], but very symptomatic--mainly when sits back up.  Possibly a bit more symptomatic with looking right, but not significantly so.   Impression & Recommendations:  Problem # 1:  VERTIGO (ICD-780.4) Suspect this is more likely peripheral, but with past pointing, concern for cerebellar or central focus, particularly with hx of lung cancer. Orders: MRI with Contrast (MRI w/Contrast)  Problem # 2:  DEPRESSION (ICD-311) increase Sertraline His updated medication list for this problem includes:    Klonopin 1 Mg Tabs (Clonazepam) .Marland Kitchen... 1 tab by mouth two times a day    Wellbutrin Sr 150 Mg Tb12 (Bupropion hcl) .Marland Kitchen... 1 tablet by mouth in the morning    Sertraline Hcl 100 Mg Tabs (Sertraline hcl) .Marland Kitchen... 1 tab by mouth daily    Diazepam 5 Mg Tabs (Diazepam) .Marland Kitchen... 1 tab by mouth 1/2 hour prior to mri  Problem # 3:  COPD, MILD (ICD-496) Start Ipratropium for --Duoneb Stop smoking His updated medication list for this problem includes:    Albuterol 90 Mcg/act Aers (Albuterol) .Marland Kitchen... 2 puffs q4h as needed    Albuterol Sulfate (2.5  Mg/41ml) 0.083% Nebu (Albuterol sulfate) ..... Nebulize every 4 hours as needed for wheezing.    Ipratropium-albuterol 0.5-2.5 (3) Mg/56ml Soln (Ipratropium-albuterol) ..... Nebulize  4 times daily  Problem # 4:  TOBACCO ABUSE (ICD-305.1) as above  Complete Medication List: 1)  Robaxin-750 750 Mg Tabs (Methocarbamol) .Marland Kitchen.. 1 tab by mouth four times daily 2)  Ambien Cr 12.5 Mg Tbcr (Zolpidem tartrate) .Marland Kitchen.. 1 tab by mouth q hs as needed sleep 3)  Klonopin 1 Mg Tabs (Clonazepam) .Marland Kitchen.. 1 tab by mouth two times a day 4)  Wellbutrin Sr 150 Mg Tb12 (Bupropion hcl) .Marland Kitchen.. 1 tablet by mouth in the morning 5)  Albuterol 90 Mcg/act Aers (Albuterol) .... 2 puffs q4h as needed 6)  Albuterol Sulfate (2.5 Mg/80ml) 0.083% Nebu (  Albuterol sulfate) .... Nebulize every 4 hours as needed for wheezing. 7)  Nebulizer Misc (Nebulizers) .... Use for nebulization prn 8)  Fluticasone Propionate 50 Mcg/act Susp (Fluticasone propionate) .... 2 sprays each nostril daily 9)  Finasteride 5 Mg Tabs (Finasteride) .Marland Kitchen.. 1 tab by mouth daily 10)  Flomax 0.4 Mg Caps (Tamsulosin hcl) .Marland Kitchen.. 1 cap by mouth with supper daily 11)  Aerochamber Mv Misc (Spacer/aero-holding chambers) .... Use with ventolin mdi 12)  Opana Er 40 Mg Xr12h-tab (Oxymorphone hcl) .Marland Kitchen.. 1 tab by mouth every 8 hours--pain clinic 13)  Sertraline Hcl 100 Mg Tabs (Sertraline hcl) .Marland Kitchen.. 1 tab by mouth daily 14)  Diazepam 5 Mg Tabs (Diazepam) .Marland Kitchen.. 1 tab by mouth 1/2 hour prior to mri 15)  Ipratropium-albuterol 0.5-2.5 (3) Mg/54ml Soln (Ipratropium-albuterol) .... Nebulize  4 times daily  Patient Instructions: 1)  Follow up with Dr. Delrae Alfred in 1 month --depression/vertigo Prescriptions: ALBUTEROL SULFATE (2.5 MG/3ML) 0.083% NEBU (ALBUTEROL SULFATE) nebulize every 4 hours as needed for wheezing.  #24 x 6   Entered and Authorized by:   Julieanne Manson MD   Signed by:   Julieanne Manson MD on 10/31/2009   Method used:   Electronically to        [facility] [address]  [phone]* (retail)       [address]       [address]       [place], [place]  [address]       Ph: [phone]       Fax: [phone]   RxID:   [id number] IPRATROPIUM-ALBUTEROL 0.5-2.5 (3) MG/3ML SOLN (IPRATROPIUM-ALBUTEROL) nebulize  4 times daily  #120 x 11   Entered and Authorized by:   Julieanne Manson MD   Signed by:   Julieanne Manson MD on 10/31/2009   Method used:   Electronically to        [facility] [address] [phone]* (retail)       [address]       [address]       [place], [place]  [address]       Ph: [phone]       Fax: [phone]   RxID:   [id number] SERTRALINE HCL 100 MG TABS (SERTRALINE HCL) 1 tab by mouth daily  #30 x 1   Entered and Authorized by:   Julieanne Manson MD   Signed by:   Julieanne Manson MD on 10/31/2009   Method used:   Electronically to        [facility] [address] [phone]* (retail)       [address]       [address]       [place], [place]  [address]       Ph: [phone]       Fax: [phone]   RxID:   [id number] DIAZEPAM 5 MG TABS (DIAZEPAM) 1 tab by mouth 1/2 hour prior to MRI  #1 x 0   Entered and Authorized by:   Julieanne Manson MD   Signed by:   Julieanne Manson MD on 10/31/2009   Method used:   Print then Give to Patient   RxID:   [id number]

## 2010-02-24 NOTE — Assessment & Plan Note (Signed)
Summary: FU///KT   Vital Signs:  Patient profile:   55 year old male Weight:      138.56 pounds Temp:     97.5 degrees F oral Pulse rate:   80 / minute Pulse rhythm:   regular Resp:     26 per minute BP sitting:   108 / 78  (left arm) Cuff size:   regular  Vitals Entered By: Hale Drone CMA (December 30, 2009 2:30 PM) CC: f/u on depression and vertigo.  Is Patient Diabetic? No Pain Assessment Patient in pain? yes     Location: back Intensity: 7 Type: sharp Onset of pain  Constant  Does patient need assistance? Functional Status Self care Ambulation Normal   CC:  f/u on depression and vertigo. Marland Kitchen  History of Present Illness: 1.  Left internal carotid artery aneurysm-41mm:  was seen by NS at [facility] and stated no need to evaluate further.  To call again if headaches with enlargement.  Consider rescan in 2 years.    2.  Depression:  Zoloft increased in October.  Felt it helped for about 3 weeks, then negative things going on in his life seem to be weighing more on him.  Has not been back to see Aquilla Solian.    3.  COPD:  did get ipratropium.  States he received two different boxes of medication--one container has a nebulized med that works well, the other does not help.  He cannot say if both are ipratropium or not.   4.  Dizziness:  better--only occasional mild symptoms    Current Medications (verified): 1)  Robaxin-750 750 Mg  Tabs (Methocarbamol) .Marland Kitchen.. 1 Tab By Mouth Four Times Daily 2)  Ambien Cr 12.5 Mg  Tbcr (Zolpidem Tartrate) .Marland Kitchen.. 1 Tab By Mouth Q Hs As Needed Sleep 3)  Klonopin 1 Mg Tabs (Clonazepam) .Marland Kitchen.. 1 Tab By Mouth Two Times A Day 4)  Wellbutrin Sr 150 Mg  Tb12 (Bupropion Hcl) .Marland Kitchen.. 1 Tablet By Mouth in The Morning 5)  Albuterol 90 Mcg/act  Aers (Albuterol) .... 2 Puffs Q4h As Needed 6)  Albuterol Sulfate (2.5 Mg/51ml) 0.083% Nebu (Albuterol Sulfate) .... Nebulize Every 4 Hours As Needed For Wheezing. 7)  Nebulizer  Misc (Nebulizers) .... Use For Nebulization  Prn 8)  Fluticasone Propionate 50 Mcg/act Susp (Fluticasone Propionate) .... 2 Sprays Each Nostril Daily 9)  Finasteride 5 Mg Tabs (Finasteride) .Marland Kitchen.. 1 Tab By Mouth Daily 10)  Flomax 0.4 Mg Caps (Tamsulosin Hcl) .Marland Kitchen.. 1 Cap By Mouth With Supper Daily 11)  Aerochamber Mv  Misc (Spacer/aero-Holding Chambers) .... Use With Ventolin Mdi 12)  Opana Er 40 Mg Xr12h-Tab (Oxymorphone Hcl) .Marland Kitchen.. 1 Tab By Mouth Every 8 Hours--Pain Clinic 13)  Sertraline Hcl 100 Mg Tabs (Sertraline Hcl) .Marland Kitchen.. 1 Tab By Mouth Daily 14)  Diazepam 5 Mg Tabs (Diazepam) .Marland Kitchen.. 1 Tab By Mouth 1/2 Hour Prior To Mri 15)  Ipratropium-Albuterol 0.5-2.5 (3) Mg/62ml Soln (Ipratropium-Albuterol) .... Nebulize  4 Times Daily 16)  Azithromycin 250 Mg Tabs (Azithromycin) .... 2 Tab By Mouth Today, Then 1 Tab By Mouth Daily For 4 More Days.  Pt. Is Penicillin Allergic  Allergies (verified): 1)  ! Penicillin 2)  Codeine 3)  Aspirin  Physical Exam  Ears:  External ear exam shows no significant lesions or deformities.  Otoscopic examination reveals clear canals, tympanic membranes are intact bilaterally without bulging, retraction, inflammation or discharge. Hearing is grossly normal bilaterally. Lungs:  Decreased BS throughout without crackles or wheeze Heart:  Normal  rate and regular rhythm. S1 and S2 normal without gallop, murmur, click, rub or other extra sounds.  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 no further evaluation for now--[facility] NS  Problem # 2:  VERTIGO (ICD-780.4) Essentially resolved.  Problem # 3:  DEPRESSION (ICD-311) Initial improvment with increase of Zoloft. Would like him to go back to [facility] for more counseling. His updated medication list for this problem includes:    Valium 5 Mg Tabs (Diazepam) .Marland Kitchen... 1 tab by mouth two times a day --pain clinic    Wellbutrin Sr  150 Mg Tb12 (Bupropion hcl) .Marland Kitchen... 1 tablet by mouth in the morning    Sertraline Hcl 100 Mg Tabs (Sertraline hcl) .Marland Kitchen... 1 tab by mouth daily    Diazepam 5 Mg Tabs (Diazepam) .Marland Kitchen... 1 tab by mouth 1/2 hour prior to mri  Problem # 4:  COPD, MILD (ICD-496) Pt. not using nebulized medication appropriately--using only as needed and really using only the albuterol. Discussed Iptrtropium will not work if not used regularly --asked if he could at least try to use three times a day --he is agreeable His updated medication list for this problem includes:    Albuterol 90 Mcg/act Aers (Albuterol) .Marland Kitchen... 2 puffs q4h as needed    Albuterol Sulfate (2.5 Mg/46ml) 0.083% Nebu (Albuterol sulfate) ..... Nebulize every 4 hours as needed for wheezing.    Ipratropium-albuterol 0.5-2.5 (3) Mg/70ml Soln (Ipratropium-albuterol) ..... Nebulize  4 times daily  Complete Medication List: 1)  Robaxin-750 750 Mg Tabs (Methocarbamol) .Marland Kitchen.. 1 tab by mouth four times daily 2)  Ambien Cr 12.5 Mg Tbcr (Zolpidem tartrate) .Marland Kitchen.. 1 tab by mouth q hs as needed sleep 3)  Valium 5 Mg Tabs (Diazepam) .Marland Kitchen.. 1 tab by mouth two times a day --pain clinic 4)  Wellbutrin Sr 150 Mg Tb12 (Bupropion hcl) .Marland Kitchen.. 1 tablet by mouth in the morning 5)  Albuterol 90 Mcg/act Aers (Albuterol) .... 2 puffs q4h as needed 6)  Albuterol Sulfate (2.5 Mg/22ml) 0.083% Nebu (Albuterol sulfate) .... Nebulize every 4 hours as needed for wheezing. 7)  Nebulizer Misc (Nebulizers) .... Use for nebulization prn 8)  Fluticasone Propionate 50 Mcg/act Susp (Fluticasone propionate) .... 2 sprays each nostril daily 9)  Finasteride 5 Mg Tabs (Finasteride) .Marland Kitchen.. 1 tab by mouth daily 10)  Flomax 0.4 Mg Caps (Tamsulosin hcl) .Marland Kitchen.. 1 cap by mouth with supper daily 11)  Aerochamber Mv Misc (Spacer/aero-holding chambers) .... Use with ventolin mdi 12)  Opana Er 40 Mg Xr12h-tab (Oxymorphone hcl) .Marland Kitchen.. 1 tab by mouth every 8 hours--pain clinic 13)  Sertraline Hcl 100 Mg Tabs (Sertraline  hcl) .Marland Kitchen.. 1 tab by mouth daily 14)  Diazepam 5 Mg Tabs (Diazepam) .Marland Kitchen.. 1 tab by mouth 1/2 hour prior to mri 15)  Ipratropium-albuterol 0.5-2.5 (3) Mg/89ml Soln (Ipratropium-albuterol) .... Nebulize  4 times daily 16)  Azithromycin 250 Mg Tabs (Azithromycin) .... 2 tab by mouth today, then 1 tab by mouth daily for 4 more days.  pt. is penicillin allergic  Patient Instructions: 1)  Referral to [facility] available please.  Let her know Zoloft increased in October--helped and then did not seem to continue to do so. 2)  Follow up with Dr. Delrae Alfred in 4 months    Orders Added: 1)  Est. Patient Level III [99213]   Immunization History:  Influenza Immunization History:    Influenza:  fluvax  3+ (12/24/2009)   Immunization History:  Influenza Immunization History:    Influenza:  Fluvax 3+ (12/24/2009)   Appended Document: Tdap     Allergies: 1)  ! Penicillin 2)  Codeine 3)  Aspirin   Complete Medication List: 1)  Robaxin-750 750 Mg Tabs (Methocarbamol) .Marland Kitchen.. 1 tab by mouth four times daily 2)  Ambien Cr 12.5 Mg Tbcr (Zolpidem tartrate) .Marland Kitchen.. 1 tab by mouth q hs as needed sleep 3)  Valium 5 Mg Tabs (Diazepam) .Marland Kitchen.. 1 tab by mouth two times a day --pain clinic 4)  Wellbutrin Sr 150 Mg Tb12 (Bupropion hcl) .Marland Kitchen.. 1 tablet by mouth in the morning 5)  Albuterol 90 Mcg/act Aers (Albuterol) .... 2 puffs q4h as needed 6)  Albuterol Sulfate (2.5 Mg/5ml) 0.083% Nebu (Albuterol sulfate) .... Nebulize every 4 hours as needed for wheezing. 7)  Nebulizer Misc (Nebulizers) .... Use for nebulization prn 8)  Fluticasone Propionate 50 Mcg/act Susp (Fluticasone propionate) .... 2 sprays each nostril daily 9)  Finasteride 5 Mg Tabs (Finasteride) .Marland Kitchen.. 1 tab by mouth daily 10)  Flomax 0.4 Mg Caps (Tamsulosin hcl) .Marland Kitchen.. 1 cap by mouth with supper daily 11)  Aerochamber Mv Misc (Spacer/aero-holding chambers) .... Use with ventolin mdi 12)  Opana Er 40 Mg Xr12h-tab (Oxymorphone hcl) .Marland Kitchen.. 1 tab by  mouth every 8 hours--pain clinic 13)  Sertraline Hcl 100 Mg Tabs (Sertraline hcl) .Marland Kitchen.. 1 tab by mouth daily 14)  Diazepam 5 Mg Tabs (Diazepam) .Marland Kitchen.. 1 tab by mouth 1/2 hour prior to mri 15)  Ipratropium-albuterol 0.5-2.5 (3) Mg/27ml Soln (Ipratropium-albuterol) .... Nebulize  4 times daily 16)  Azithromycin 250 Mg Tabs (Azithromycin) .... 2 tab by mouth today, then 1 tab by mouth daily for 4 more days.  pt. is penicillin allergic  Other Orders: Tdap => 108yrs IM (16109) Admin 1st Vaccine (60454)   Orders Added: 1)  Tdap => 60yrs IM [90715] 2)  Admin 1st Vaccine [09811]   Immunizations Administered:  Tetanus Vaccine:    Vaccine Type: Tdap    Site: right deltoid    Mfr: Sanofi Pasteur    Dose: 0.5 ml    Route: IM    Given by: Hale Drone CMA    Exp. Date: 04/23/2012    Lot #: [id number]    VIS given: 12/13/07 version given December 30, 2009.   Immunizations Administered:  Tetanus Vaccine:    Vaccine Type: Tdap    Site: right deltoid    Mfr: Sanofi Pasteur    Dose: 0.5 ml    Route: IM    Given by: Hale Drone CMA    Exp. Date: 04/23/2012    Lot #: [id number]    VIS given: 12/13/07 version given December 30, 2009.

## 2010-02-24 NOTE — Medication Information (Signed)
Summary: NEBULIZER/INHALATION DRUGS  NEBULIZER/INHALATION DRUGS   Imported By: Arta Bruce 12/01/2009 10:03:42  _____________________________________________________________________  External Attachment:    Type:   Image     Comment:   External Document

## 2010-02-24 NOTE — Progress Notes (Signed)
  Phone Note Outgoing Call   Call placed by: Julieanne Manson MD,  August 05, 2009 10:47 AM Summary of Call: Joseph Torres:  Urology referral-- note, order. Initial call taken by: Julieanne Manson MD,  August 05, 2009 10:47 AM

## 2010-02-24 NOTE — Letter (Signed)
Summary: [facility]MEDICAL [facility]ECESS[facility]  [facility]   Imported By: Arta Bruce 04/01/2009 14:27:13  _____________________________________________________________________  External Attachment:    Type:   Image     Comment:   External Document

## 2010-02-24 NOTE — Letter (Signed)
Summary[facility]   Imported By: Arta Bruce 02/25/2009 15:33:05  _____________________________________________________________________  External Attachment:    Type:   Image     Comment:   External Document

## 2010-02-24 NOTE — Miscellaneous (Signed)
Summary: FLU SHOT  FLU SHOT   Imported By: Arta Bruce 08/20/2009 15:25:36  _____________________________________________________________________  External Attachment:    Type:   Image     Comment:   External Document

## 2010-02-24 NOTE — Assessment & Plan Note (Signed)
Summary: BP check /tmm  Nurse Visit   Vital Signs:  Patient profile:   55 year old male Pulse rate:   103 / minute BP sitting:   110 / 70  (left arm) CC: BP check Comments Per [facility] the blood pressure was ok . No need for medication.   Allergies: 1)  ! Penicillin 2)  Codeine 3)  Aspirin  Appended Document: BP check /tmm   Appended Document: BP check /tmm

## 2010-02-24 NOTE — Letter (Signed)
Summary: [facility]  [facility]   Imported ByArta Bruce 12/26/2009 10:07:19  _____________________________________________________________________  External Attachment:    Type:   Image     Comment:   External Document

## 2010-02-26 NOTE — Assessment & Plan Note (Signed)
Summary: NOT FEELING WEEL//HAS DIARRHEA//SS   Vital Signs:  Patient profile:   55 year old male Weight:      138.50 pounds BMI:     22.44 Temp:     97 degrees F oral Pulse rate:   102 / minute Pulse rhythm:   regular Resp:     24 per minute BP sitting:   106 / 84  (left arm) Cuff size:   regular  Vitals Entered By: Hale Drone CMA (January 27, 2010 2:21 PM) CC: Diarrhea x2 weeks. Has not noticed any mucous or blood in the stools. It is very watery. Anything he eats will cause him to have diarrhea. Feeling weak w/ ocassional HA's. Had abd pain maybe on the 2cnd day but since than, no pain. No fever vomitting or nauseas.   Is Patient Diabetic? No Pain Assessment Patient in pain? no       Does patient need assistance? Functional Status Self care Ambulation Normal   CC:  Diarrhea x2 weeks. Has not noticed any mucous or blood in the stools. It is very watery. Anything he eats will cause him to have diarrhea. Feeling weak w/ ocassional HA's. Had abd pain maybe on the 2cnd day but since than and no pain. No fever vomitting or nauseas.  .  History of Present Illness: 1.  Diarrhea:  Started 2 weeks ago.  States initially felt weak and just not feeling well.  No nausea or vomiting.  Had some abdominal discomfort initially--lower right, but that resolved after intial presentation.  Had a very voluminous watery brown stool.  No blood, mucous or melena.  Has been able to drink fluids--Welch's Grape Juice, water.  Appetite is okay much of time.  Has to run to bathroom to have a BM after eating.  Stools are explosive.  Has had normal stools occasionally mixed in with this.  Has not been on antibiotics since October.  No fevers.  Is very stressed.  Cannot recall eating any questionable food recently.  No one else around him has been ill.  Current Medications (verified): 1)  Robaxin-750 750 Mg  Tabs (Methocarbamol) .Marland Kitchen.. 1 Tab By Mouth Four Times Daily 2)  Ambien Cr 12.5 Mg  Tbcr (Zolpidem Tartrate)  .Marland Kitchen.. 1 Tab By Mouth Q Hs As Needed Sleep 3)  Valium 5 Mg Tabs (Diazepam) .Marland Kitchen.. 1 Tab By Mouth Two Times A Day --[facility] 4)  Wellbutrin Sr 150 Mg  Tb12 (Bupropion Hcl) .Marland Kitchen.. 1 Tablet By Mouth in The Morning 5)  Albuterol 90 Mcg/act  Aers (Albuterol) .... 2 Puffs Q4h As Needed 6)  Albuterol Sulfate (2.5 Mg/2ml) 0.083% Nebu (Albuterol Sulfate) .... Nebulize Every 4 Hours As Needed For Wheezing. 7)  Nebulizer  Misc (Nebulizers) .... Use For Nebulization Prn 8)  Fluticasone Propionate 50 Mcg/act Susp (Fluticasone Propionate) .... 2 Sprays Each Nostril Daily 9)  Finasteride 5 Mg Tabs (Finasteride) .Marland Kitchen.. 1 Tab By Mouth Daily 10)  Flomax 0.4 Mg Caps (Tamsulosin Hcl) .Marland Kitchen.. 1 Cap By Mouth With Supper Daily 11)  Aerochamber Mv  Misc (Spacer/aero-Holding Chambers) .... Use With Ventolin Mdi 12)  Opana Er 40 Mg Xr12h-Tab (Oxymorphone Hcl) .Marland Kitchen.. 1 Tab By Mouth Every 8 Hours--[facility] 13)  Sertraline Hcl 100 Mg Tabs (Sertraline Hcl) .Marland Kitchen.. 1 Tab By Mouth Daily 14)  Diazepam 5 Mg Tabs (Diazepam) .Marland Kitchen.. 1 Tab By Mouth 1/2 Hour Prior To Mri 15)  Ipratropium-Albuterol 0.5-2.5 (3) Mg/24ml Soln (Ipratropium-Albuterol) .... Nebulize  4 Times Daily 16)  Azithromycin 250 Mg  Tabs (Azithromycin) .... 2 Tab By Mouth Today, Then 1 Tab By Mouth Daily For 4 More Days.  Pt. Is Penicillin Allergic  Allergies (verified): 1)  ! Penicillin 2)  Codeine 3)  Aspirin  Physical Exam  General:  NAD Lungs:  Normal respiratory effort, chest expands symmetrically. Lungs are clear to auscultation, no crackles or wheezes. Heart:  Normal rate and regular rhythm. S1 and S2 normal without gallop, murmur, click, rub or other extra sounds. Abdomen:  Mild tenderness in LLQ with compression in RLQ.  soft, normal bowel sounds, no guarding, no rigidity, no rebound tenderness, no hepatomegaly, and no splenomegaly.     Impression & Recommendations:  Problem # 1:  DIARRHEA, ACUTE (ICD-787.91) Looks fairly well.  Orders: T-CBC w/Diff  [id number]) T-Comprehensive Metabolic Panel [id number]) T-Culture, C-Diff Toxin A/B ([id number]) T-Culture, Stool ([id number]) T- * Misc. Laboratory test [id number])  Complete Medication List: 1)  Robaxin-750 750 Mg Tabs (Methocarbamol) .Marland Kitchen.. 1 tab by mouth four times daily 2)  Ambien Cr 12.5 Mg Tbcr (Zolpidem tartrate) .Marland Kitchen.. 1 tab by mouth q hs as needed sleep 3)  Valium 5 Mg Tabs (Diazepam) .Marland Kitchen.. 1 tab by mouth two times a day --pain clinic 4)  Wellbutrin Sr 150 Mg Tb12 (Bupropion hcl) .Marland Kitchen.. 1 tablet by mouth in the morning 5)  Albuterol 90 Mcg/act Aers (Albuterol) .... 2 puffs q4h as needed 6)  Albuterol Sulfate (2.5 Mg/42ml) 0.083% Nebu (Albuterol sulfate) .... Nebulize every 4 hours as needed for wheezing. 7)  Nebulizer Misc (Nebulizers) .... Use for nebulization prn 8)  Fluticasone Propionate 50 Mcg/act Susp (Fluticasone propionate) .... 2 sprays each nostril daily 9)  Finasteride 5 Mg Tabs (Finasteride) .Marland Kitchen.. 1 tab by mouth daily 10)  Flomax 0.4 Mg Caps (Tamsulosin hcl) .Marland Kitchen.. 1 cap by mouth with supper daily 11)  Aerochamber Mv Misc (Spacer/aero-holding chambers) .... Use with ventolin mdi 12)  Opana Er 40 Mg Xr12h-tab (Oxymorphone hcl) .Marland Kitchen.. 1 tab by mouth every 8 hours--pain clinic 13)  Sertraline Hcl 100 Mg Tabs (Sertraline hcl) .Marland Kitchen.. 1 tab by mouth daily 14)  Diazepam 5 Mg Tabs (Diazepam) .Marland Kitchen.. 1 tab by mouth 1/2 hour prior to mri 15)  Ipratropium-albuterol 0.5-2.5 (3) Mg/35ml Soln (Ipratropium-albuterol) .... Nebulize  4 times daily 16)  Azithromycin 250 Mg Tabs (Azithromycin) .... 2 tab by mouth today, then 1 tab by mouth daily for 4 more days.  pt. is penicillin allergic  Patient Instructions: 1)  BRAT diet:  bananas, rice, rice cereal, apple sauce, Toast, crackers, broth, clear liquids--not with sweeteners or sugar--not juice or dairy 2)  Can start adding regular diet back as stools improve   Orders Added: 1)  T-CBC w/Diff [[id number]] 2)  T-Comprehensive Metabolic Panel  [[id number]] 3)  T-Culture, C-Diff Toxin A/B [[id number]] 4)  T-Culture, Stool [[id number]] 5)  T- * Misc. Laboratory test [[id number]] 6)  Est. Patient Level III [99213]  Appended Document: NOT FEELING WEEL//HAS DIARRHEA//SS wants Zoloft increased   Clinical Lists Changes  Medications: Changed medication from SERTRALINE HCL 100 MG TABS (SERTRALINE HCL) 1 tab by mouth daily to SERTRALINE HCL 100 MG TABS (SERTRALINE HCL) 1 1/2  tabs by mouth daily - Signed Rx of SERTRALINE HCL 100 MG TABS (SERTRALINE HCL) 1 1/2  tabs by mouth daily;  #45 x 11;  Signed;  Entered by: Julieanne Manson MD;  Authorized by: Julieanne Manson MD;  Method used: Electronically to [facility] [address] [phone]*, [address] [place], [place]  [address], Ph: [phone], Fax: [phone]  Prescriptions: SERTRALINE HCL 100 MG TABS (SERTRALINE HCL) 1 1/2  tabs by mouth daily  #45 x 11   Entered and Authorized by:   Julieanne Manson MD   Signed by:   Julieanne Manson MD on 01/27/2010   Method used:   Electronically to        [facility] [address] [phone]* (retail)       [address]       [address]       [place], [place]  [address]       Ph: [phone]       Fax: [phone]   RxID:   [id number]

## 2010-02-26 NOTE — Progress Notes (Signed)
Summary: Query Sertraline refill  Phone Note From Pharmacy   Caller: [facility] [address] [phone]* Summary of Call: Last seen 12/06, Sertraline last filled 10/31/09 #90 with no refills. Had indicated on 12/30/09 visit helped some at first but not helping now. Do you want to refill? Gaylyn Cheers RN  January 20, 2010 2:52 PM   Follow-up for Phone Call        pt call need to know if the refill is ready Follow-up by: Domenic Polite,  January 21, 2010 11:39 AM  Additional Follow-up for Phone Call Additional follow up Details #1::        He needs to stay on 100mg --please refill Additional Follow-up by: Julieanne Manson MD,  January 21, 2010 1:32 PM    Additional Follow-up for Phone Call Additional follow up Details #2::    Noted.  Refill completed.  Dutch Quint RN  January 21, 2010 3:45 PM   Prescriptions: SERTRALINE HCL 100 MG TABS (SERTRALINE HCL) 1 tab by mouth daily  #90 Each x 0   Entered by:   Dutch Quint RN   Authorized by:   Julieanne Manson MD   Signed by:   Dutch Quint RN on 01/21/2010   Method used:   Electronically to        [facility] [address] [phone]* (retail)       [address]       [address]       [place], [place]  [address]       Ph: [phone]       Fax: [phone]   RxID:   [id number]

## 2010-03-17 ENCOUNTER — Other Ambulatory Visit: Payer: Self-pay | Admitting: Thoracic Surgery (Cardiothoracic Vascular Surgery)

## 2010-03-17 DIAGNOSIS — C3410 Malignant neoplasm of upper lobe, unspecified bronchus or lung: Secondary | ICD-10-CM

## 2010-03-18 ENCOUNTER — Encounter (INDEPENDENT_AMBULATORY_CARE_PROVIDER_SITE_OTHER): Payer: Medicare Other | Admitting: Thoracic Surgery (Cardiothoracic Vascular Surgery)

## 2010-03-18 ENCOUNTER — Encounter (INDEPENDENT_AMBULATORY_CARE_PROVIDER_SITE_OTHER): Payer: Self-pay | Admitting: Internal Medicine

## 2010-03-18 ENCOUNTER — Ambulatory Visit
Admission: RE | Admit: 2010-03-18 | Discharge: 2010-03-18 | Disposition: A | Discharge status: Home / Self Care | Payer: Medicare Other | Source: Ambulatory Visit | Attending: Thoracic Surgery (Cardiothoracic Vascular Surgery) | Admitting: Thoracic Surgery (Cardiothoracic Vascular Surgery)

## 2010-03-18 DIAGNOSIS — C3410 Malignant neoplasm of upper lobe, unspecified bronchus or lung: Secondary | ICD-10-CM

## 2010-03-19 NOTE — Assessment & Plan Note (Signed)
OFFICE VISIT  ANIS, DEGIDIO DOB:  Oct 15, 1955                                        March 18, 2010 CHART #:  [id number]  REASON FOR VISIT:  Scheduled 32-month followup visit.  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  He was doing well at that time, was continuing to smoke four 4-5 cigarettes a day at that point.  He states that since his last visit he has been doing well, his weight has been stable.  He is still smoking he says cigarette now and then, but really is not much more specific than that, not had any change in his cough pattern, has not had any hemoptysis and overall feels well.  CURRENT MEDICATIONS: 1. Flomax 0.4 mg daily. 2. Wellbutrin 150 mg daily. 3. Zoloft 150 mg daily. 4. Opana 10 mg for pain. 5. Valium 5 mg b.i.d. 6. Ambien at bedtime p.r.n. 7. He also uses a nebulizer p.r.n.  He has allergies to PENICILLIN, CODEINE, and ASPIRIN.  SOCIAL HISTORY:  As noted still occasionally smoke cigarettes, although he says less than a pack a week and usually may be only 1-2 cigarettes every few days, but is again not specific.  He still lives with his wife at home and continues to smoke.  REVIEW OF SYSTEMS:  See HPI, otherwise all systems are negative.  PHYSICAL EXAMINATION:  GENERAL:  The patient is a thin 55 year old gentleman, in no acute distress.  VITAL SIGNS:  Blood pressure is 91/62, pulse 76, respirations 16, oxygen saturation is 93% on room air.  NECK: There is no cervical or supraclavicular adenopathy.  LUNGS:  Distant breath sounds bilaterally.  There is no wheezing, rales, or rhonchi.  LABORATORY DATA:  Chest x-ray is reviewed, shows no evidence of recurrent disease.  There are postoperative changes, which have been stable over time.  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  He has no evidence of recurrent disease.  We will plan to see him back in the summer for his 4- year followup visit.  We will do a CT of the chest at that time.  Once again counseled him regarding smoking cessation.  It is clear that he does understand all of the issues.  Salvatore Decent Dorris Fetch, M.D. Electronically Signed  SCH/MEDQ  D:  03/18/2010  T:  03/19/2010  Job:  [id number]  cc:   Marcene Duos, M.D.

## 2010-04-02 NOTE — Letter (Signed)
Summary: NEUROSURGERY  NEUROSURGERY   Imported By: Arta Bruce 03/24/2010 15:06:33  _____________________________________________________________________  External Attachment:    Type:   Image     Comment:   External Document

## 2010-04-23 NOTE — Letter (Signed)
Summary: OFFICE VISIT DR.STEVEN HENDRICKSON  OFFICE VISIT DR.STEVEN HENDRICKSON   Imported By: Arta Bruce 04/16/2010 15:13:41  _____________________________________________________________________  External Attachment:    Type:   Image     Comment:   External Document

## 2010-04-28 LAB — DIFFERENTIAL
Basophils Absolute: 0.1 10*3/uL (ref 0.0–0.1)
Eosinophils Relative: 2 % (ref 0–5)
Lymphocytes Relative: 11 % — ABNORMAL LOW (ref 12–46)
Lymphs Abs: 1 10*3/uL (ref 0.7–4.0)
Monocytes Absolute: 1.1 10*3/uL — ABNORMAL HIGH (ref 0.1–1.0)
Monocytes Relative: 12 % (ref 3–12)
Neutro Abs: 6.5 10*3/uL (ref 1.7–7.7)

## 2010-04-28 LAB — CBC
HCT: 36.1 % — ABNORMAL LOW (ref 39.0–52.0)
Platelets: 279 10*3/uL (ref 150–400)
RDW: 14.1 % (ref 11.5–15.5)
WBC: 8.8 10*3/uL (ref 4.0–10.5)

## 2010-04-28 LAB — TROPONIN I: Troponin I: <0.01 ng/mL (ref 0.00–0.06)

## 2010-04-28 LAB — CK TOTAL AND CKMB (NOT AT ARMC)
CK, MB: 0.8 ng/mL (ref 0.3–4.0)
CK, MB: 1.3 ng/mL (ref 0.3–4.0)
Relative Index: 1.3 (ref 0.0–2.5)
Total CK: 103 U/L (ref 7–232)
Total CK: 68 U/L (ref 7–232)
Total CK: 85 U/L (ref 7–232)

## 2010-04-28 LAB — COMPREHENSIVE METABOLIC PANEL
AST: 20 U/L (ref 0–37)
Albumin: 3 g/dL — ABNORMAL LOW (ref 3.5–5.2)
BUN: 8 mg/dL (ref 6–23)
Calcium: 8.7 mg/dL (ref 8.4–10.5)
Creatinine, Ser: 1.03 mg/dL (ref 0.4–1.5)
GFR calc Af Amer: >60 mL/min (ref >60)
Total Bilirubin: 0.5 mg/dL (ref 0.3–1.2)
Total Protein: 7 g/dL (ref 6.0–8.3)

## 2010-04-28 LAB — POCT CARDIAC MARKERS
CKMB, poc: 2 ng/mL (ref 1.0–8.0)
Troponin i, poc: <0.05 ng/mL (ref 0.00–0.09)

## 2010-06-09 NOTE — H&P (Signed)
HISTORY AND PHYSICAL EXAMINATION   August 16, 2006   Re:  Joseph Torres        DOB:  05/17/1955   CHIEF COMPLAINT:  Lung nodule.   HISTORY OF PRESENT ILLNESS:  Patient is a 55 year old gentleman with a  history of tobacco abuse who had pneumonia in April of this year.  A CT  scan was done at that time and showed a tree and bud pattern in the  right upper lobe consistent with pneumonia.  There was also a 9 mm ill-  defined density in the left upper lobe, anteromedially.  This was  thought to possibly represent scarring, but it was recommended that a  repeat scan be done.  He recently had the repeat CT scan performed, and  it showed an increase in size of the nodule.  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  Given the multiple factors, it was  felt best to go ahead and proceed with a CT scan to insure there was no  evidence of bony metastasis or adrenal metastasis.  The CT PET was done  on the 17th of this month and showed increased uptake in the left upper  lobe nodule, an SUV of 3.6.  The lesion was 1.4 x 0.7 cm in size.  The  adrenal glands showed no increase in metabolic activity.  There was also  no abnormal uptake noted elsewhere in the body.   Patient continues to smoke.  He used to smoke more than two packs per  day.  He now says he is down to about one pack per day.  He has smoked  for 40 years.  He has chronic anxiety issues and has been particularly  stirred by this recent information.  He has not had any recent wheezing.  He denies any hemoptysis.  No recent visual changes or unusual  headaches.   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   CURRENT MEDICATIONS:  1. Lyrica 100 mg p.o. q.i.d.  2. OxyContin 40 mg p.o. q.i.d.  3. Celebrex 200 mg p.o. b.i.d.  4. Robaxin 75 mg p.o. 4 times daily.  5. Klonopin 0.4 mg p.o. 3 times daily.   He is schedule to have injection to burn the nerve in his lower back  in [facility] by Dr. Marlynn Perking on Friday.   He has adverse reactions to ASPIRIN and PENICILLIN.   FAMILY HISTORY:  Noncontributory.   SOCIAL HISTORY:  He is married.  He is fully disabled.  He has six  children.  He still smokes about a pack a day and has about an 80-pack-  year history.  He used to drink heavily but has not drank for several  years.   REVIEW OF SYSTEMS:  Patient's medical history form is reviewed and is on  the chart.  He does complain of chronic arthritis joint and back pain  and neck pain, left elbow pain, recent median nerve surgery.  Chronic  anxiety.  All other systems are negative.   PHYSICAL EXAMINATION:  Patient is a very anxious 55 year old white male  in no acute distress.  Blood pressure is 107/72, pulse  92 and regular,  respirations 18, oxygen saturation is 94% on room air.  His general  appearance, anxious but well-nourished and well-developed.  His HEENT  exam is unremarkable.  He does wear glasses.  Neck:  He has limited  range of motion.  He can flex normally but has markedly limited  extension.  He also has limited side-to-side movement.  There is no  thyromegaly, adenopathy, or bruits.  There is no cervical,  supraclavicular, axillary, or epitrochlear adenopathy.  Lungs are clear  with no wheezing bilaterally.  Cardiac:  Regular rate and rhythm with  normal S1 and S2.  No murmurs, rubs or gallops.  Abdomen is soft and  nontender.  Extremities without clubbing, cyanosis or edema.  He has  intact pulses.   CT scan and PET scan were reviewed.  His pulmonary function tests in the  office today showed an FVC of 3.06, 72% of normal, FEV1 of 2.15, 62% of   normal.  FEF (25-75) was 1.39, which is 39% of normal.  FEV1 to FVC  ratio was 70%.   IMPRESSION:  Patient is a 55 year old gentleman with a history of  tobacco abuse and chronic obstructive pulmonary disease, who now is  found to have a hypermetabolic lung nodule in the left lung, which has  grown since his last scan in April.  Given his history and the PET and  CT findings, this needs to be considered a lung cancer until it can be  proven otherwise.  I did inform the patient that there is a possibility  that this is not a lung cancer, but I would not be comfortable following  this with CT scans any longer, and it is recommended that we surgically  remove this lesion.  It is not particularly amenable to percutaneous  biopsy, although it is possible, I think there would be a high rate of  error and also a significant risk of pneumothorax.  Therefore, I have  recommended to him that we proceed under general anesthesia with  bronchoscopy followed by a left VATS with wedge resection followed by  frozen section.  If this did turn out to be cancer, then we would  proceed with a left upper lobectomy and note dissection.  I did discuss  with him the nature and extent of this operation and need for general  anesthesia, the potential for complications, as well as the possibility  that we may not be able to do a VATS approach because of the limited  ability to place a double endotracheal tube and that there is a  possibility that we might end up having to do this through a more  traditional thoracotomy approach.  He does understand that the risks  include but are not limited to death, MI, DVT, PE, bleeding, possible  need for transfusions, infections as well as prolonged air leakage and  other predictable complications.  He understands and accepts these risks  and agrees to proceed.  We have tentatively scheduled this surgery for  July 31.  He will be admitted on the day of surgery.  He is to have  an  injection in his back, sounds like an alcohol injection, trying to  ablate some nerves for chronic pain by Dr. Marlynn Perking on [place] [place] on  Friday.  There is no problem with him proceeding with that.  I suspect  pain control will be a significant issue for [place] postoperatively, and  patient was warned that there would be significant pain associated  with  this procedure.   Joseph Torres, M.D.  Electronically Signed   SCH/MEDQ  D:  08/16/2006  T:  08/16/2006  Job:  [id number]   cc:   Joseph Torres, M.D.

## 2010-06-09 NOTE — Assessment & Plan Note (Signed)
OFFICE VISIT   SABIN, GIBEAULT  DOB:  [birth year]                                        August 28, 2007  CHART #:  [id number]   The patient is a 55 year old gentleman who is now 1 year out from a left  upper lobectomy for stage IA adenocarcinoma.  He has had no evidence of  recurrent disease.  He now returns for his annual follow up and he has  had a PET scan.  He states that he has had some trouble with his  breathing.  He is using Spiriva, but is still having some wheezing,  particularly when it is very hot days.  His other complaint is  constipation.  He is on no medication he says that causes that.  He has  been taking ex-lax for that.  He has not noted any other change in his  bowel habits.  He has not had any blood in his stools that he is aware  of.   The patient's medical history form was reviewed and is on the chart.   PHYSICAL EXAMINATION:  GENERAL:  The patient is a 55 year old white  male, in no acute distress.  VITAL SIGNS:  Blood pressure is 134/90, pulse 86, respirations are 18,  and his oxygen saturation is 98% on room air.  LUNGS:  Some mild expiratory wheeze, right greater than left.  His  thoracotomy wound is well healed.  CARDIAC:  Regular rate and rhythm.  Normal S1 and S2.  There is no  cervical, supraclavicular, axillary, or epitrochlear adenopathy.   PET scan shows some linear scarring at the left base.  It is slightly  more prominent than previously, but there is no FDG uptake.  There is no  evidence of recurrent or metastatic disease on the PET-CT.  There is  some increased FDG uptake including the proximal descending colon and  rectum.  There is question whether this could be physiologic.  No mass  is seen in this area on the CT component.   IMPRESSION:  The patient is a 55 year old gentleman.  He is now 1 year  out from left upper lobectomy for stage IA adenocarcinoma.  He has no  evidence of recurrent disease.  I plan to  see him back in 3 months for  follow up regarding his lung cancer.  We will just do a plain chest x-  ray at that time.  I did ask him to check with Dr. Delrae Alfred.  I will  send her a copy of the PET scan to see if there is any workup that needs  to be done regarding his colon, he has had some constipation, just to  make sure there is nothing else going on in that area, and if he needs a  referral for GI consult or barium enema I will leave that up to her  discretion.   Salvatore Decent Dorris Fetch, M.D.  Electronically Signed   SCH/MEDQ  D:  08/28/2007  T:  08/29/2007  Job:  [id number]   cc:   Marcene Duos, M.D.

## 2010-06-09 NOTE — Letter (Signed)
August 28, 2007   Marcene Duos, M.D.  [address] [place] [address]   Re:  KINGSTYN, DERUITER              DOB:  1955-05-31   Dear Lanora Manis,   I saw the patient back in the office today.  As you know, he is now  about a year out from left upper lobectomy for stage IA adenocarcinoma.  On his visit today, we did a PET/CT for his 1-year followup.  There was  no evidence of recurrent disease in his chest.  There was a question of  some increased FDG uptake in the colon involving the proximal descending  colon and rectum.  This was felt by the radiologist to be nonspecific,  but on questioning, he has had some difficulties with constipation.  I  do not know if any further GI evaluation was necessary, but would defer  that to your judgment.  I will plan to see the patient back in about 3  months.  We will look at his chest x-ray at that time, but overall from  the lung cancer standpoint, he is doing extremely well.   Salvatore Decent Dorris Fetch, M.D.  Electronically Signed   SCH/MEDQ  D:  08/28/2007  T:  08/28/2007  Job:  [id number]

## 2010-06-09 NOTE — Assessment & Plan Note (Signed)
OFFICE VISIT   DARTANIAN, KNAGGS  DOB:  [birth year]                                        March 12, 2008  CHART #:  [id number]   The patient is a 55 year old gentleman who had a left upper lobectomy  for stage IA adenocarcinoma in July 2008.  He was seen in the office in  November at which time, he was doing well with no evidence of recurrent  disease.  He now returns for scheduled followup visit.  In the interim  since his last visit, he has had a carpal tunnel repair on the left  side.  He has regained significant strength in the left hand and  continues with physical therapy for that.  He is not smoking and  continues to have difficulty related to his chronic pain in his neck.   CURRENT MEDICATIONS:  Unchanged from his last visit, although he is only  using his nebulizer p.r.n. and has not had to use it recently.   REVIEW OF SYSTEMS:  Unchanged from last visit.   PHYSICAL EXAMINATION:  GENERAL:  The patient is a 55 year old gentleman,  in no acute distress.  VITAL SIGNS:  His blood pressure is 110/80, pulse 96, and respirations  are 18.  His ox saturation is 96% on room air.  LUNGS:  Clear.  There is no wheezing.  He does have diminished breath  sounds on the left compared to the right at the base.  NECK:  There is no cervical, supraclavicular, axillary, or epitrochlear  adenopathy.   CT scan is reviewed.  There appears to be resolution of 3 small areas of  ground-glass opacity over the left hemidiaphragm.  The scan has not been  officially read, but I see no evidence of recurrent disease and no new  nodules.   IMPRESSION:  The patient is doing well at this point in time.  He is now  over a year and half out from surgery.  His 2-year anniversary will be  in July.  We will plan to see him back at that  time with a repeat CT scan for the 2-year followup unless the  radiologist noted something that I did not on the CT scan.   Salvatore Decent  Dorris Fetch, M.D.  Electronically Signed   SCH/MEDQ  D:  03/12/2008  T:  03/12/2008  Job:  [id number]   cc:   Marcene Duos, M.D.

## 2010-06-09 NOTE — Assessment & Plan Note (Signed)
OFFICE VISIT   JERMICHAEL, BELMARES  DOB:  1956/01/09                                        December 27, 2006  CHART #:  [id number]   SUBJECTIVE:  Mr. Cerda is a 55 year old gentleman who had a left upper  lobectomy for stage IA adenocarcinoma on  August 25, 2006.  He did well  postoperatively and was discharged on postoperative day number six.  We  did discuss with him the possibility of seeing oncology, to discuss  potential clinical trials for stage IA, although he understands that  chemotherapy is not the standard of care for stage IA.  He declined to  do so.  He says that he still does have some incisional pain,  particularly when he lies on his left side.  His major complaint is  still related to some muscle wasting in his palm, due to carpal tunnel  syndrome, for which he has had surgery.  He has not been smoking and he  continues to be managed through the [facility].   PAST MEDICAL HISTORY:  The patient's medical history form is reviewed  and is on the chart.  There have been no interval changes since his last  visit.   PHYSICAL EXAMINATION:  GENERAL:  Mr. Delpizzo is a 55 year old white  male, in no acute distress.  VITAL SIGNS:  Blood pressure 120/87, pulse 94, respirations 18, oxygen  saturation 95% on room air.  LUNGS:  Diminished breath sounds in the left base, otherwise clear.  No  wheezing.  NODES:  No cervical, supraclavicular, axillary or epitrochlear  adenopathy.   A chest x-ray shows stable scarring in the left chest.  There is no  evidence of recurrent tumor.   IMPRESSION:  Mr. Peplinski is a 55 year old gentleman.  He is now about  four months out from a left upper lobectomy and is doing well at this  point in time.  He does still have some pain which is not surprising  with a thoracotomy.  Otherwise his respiratory status is stable and he  is not smoking.   PLAN:  I will plan to see him back in about three months, and will plan  to  do a chest x-ray at that time.  He knows to call if there are any  issues that arise in the meantime.   Salvatore Decent Dorris Fetch, M.D.  Electronically Signed   SCH/MEDQ  D:  12/27/2006  T:  12/27/2006  Job:  [id number]   cc:   Marcene Duos, M.D.

## 2010-06-09 NOTE — Op Note (Signed)
Joseph Torres, Joseph Torres              ACCOUNT NO.:  [id number]   MEDICAL RECORD NO.:  [id number]          PATIENT TYPE:  INP   LOCATION:  2307                         FACILITY:  [facility]   PHYSICIAN:  Salvatore Decent. Dorris Fetch, M.D.DATE OF BIRTH:  1955/07/01   DATE OF PROCEDURE:  08/25/2006  DATE OF DISCHARGE:                               OPERATIVE REPORT   PREOPERATIVE DIAGNOSIS:  Left upper lobe nodule.   POSTOPERATIVE DIAGNOSIS:  Stage I-Aa adenocarcinoma.   SURGEON:  Salvatore Decent. Dorris Fetch, M.D.   ASSISTANT:  Ines Bloomer, M.D.   SECOND ASSISTANT:  Burna Sis.   ANESTHESIA:  General.   FINDINGS:  1 cm mass upper medial aspect of the right middle lobe,  frozen section revealed adenocarcinoma.  Left upper lobectomy performed.  Bleeding from pulmonary artery necessitating thoracotomy, normal-  appearing level 5 nodes and hilar nodes.   CLINICAL NOTE:  Joseph Torres is a 55 year old gentleman with a history of  tobacco abuse.  He had pneumonia earlier this year and a CT scan at that  time showed a questionable nodule versus scar tissue in the left upper  lobe.  Repeat CT was done which showed slight growth in the nodule.  A  PET scan showed increased metabolic uptake in that area.  The patient  was under advised to undergo bronchoscopy and VATS with wedge resection  for diagnostic purposes with potential for thoracotomy and lobectomy if  the frozen section of the lesion was positive.  The indications,  benefits and alternatives were discussed in detail with the patient and  he understood and accepted the risks and agreed to proceed.   OPERATIVE NOTE:  Joseph Torres was brought to the preop holding on  08/25/2006.  There Anesthesia Service placed lines for monitoring  arterial, central venous pressures as well as central venous access.  Intravenous antibiotics were administered.  He was taken to the  operating room, anesthetized and intubated with a single-lumen  endotracheal tube.   Flexible fiberoptic bronchoscopy was performed and  there were some clear secretions but no endobronchial lesions seen.   The patient was reintubated with a double-lumen endotracheal tube.  He  was placed in a right lateral decubitus position.  Of note, PAS hose  were in place for DVT prophylaxis.  The left chest was prepped and  draped in usual fashion.  Single lung ventilation of the right lung was  carried out and was tolerated well throughout the procedure.   An incision was made in the midaxillary line approximately the seventh  intercostal space.  Additional smaller utility incision was made  posteriorly for retraction purposes and a minithoracotomy approximately  5 cm in length was made in the anterior axillary line.  A thoracoscope  was inserted into the chest.  Visual inspection revealed no effusion,  parietal pleural surface was normal.  The left upper lobe was grasped  and the lesion was palpated and resected with two firings of a Echelon  60 stapler and sent for frozen section.  Frozen section revealed  adenocarcinoma.  Decision was made to proceed with lobectomy and the  initial dissection  was carried out thoracoscopically the pleural  reflection medially was divided.  The pulmonary vein was exposed and  controlled and divided with ATW stapler.  The dissection was carried  more superiorly to the pulmonary arterial branches anteriorly.  During  this dissection there was bleeding.  Attempts to control the bleeding  resulted in additional bleeding which could not be controlled  thoracoscopically.  The anterior utility incision was extended to full  thoracotomy.  Retractor was placed.  Direct pressure was placed on the  bleeding site.  The main pulmonary artery was dissected out and a clamp  was placed across this.  With release of the pressure on the site of  bleeding, there was additional large amount of bleeding with a suspicion  that the clamp was not completely across the  main pulmonary artery.  Direct pressure was once again applied.  The tear in the main pulmonary  artery was repaired with a 4-0 Prolene suture after ultimately getting  complete control of the main pulmonary artery.  During this there was  significant blood loss requiring significant volume resuscitation by  anesthesia.  The blood pressure was transiently lost; however, this may  have been due to arterial line problems.  With release of the clamp  there was good hemostasis and by this point the patient also had had  return of a good A-line tracing.  The remaining of pulmonary arterial  branches then were isolated and divided.  The major fissure was  completed and the lingular branches were divided with ATW stapler.  The  bronchus then was divided with a TL 30 stapler.  The specimen was  removed and bronchial margins were negative for tumor.  Hemostasis was  achieved.  The inferior pulmonary ligament was divided to assist with  mobilization.  Level 10 and 11 lymph nodes were taken as separate  specimens in addition to the lymph nodes which were taken along with the  lobe.  The level 5 aortopulmonary window nodes had been mobilized during  the control of the pulmonary artery.  These were then taken en bloc and  sent as a single specimen.  The chest was filled with warm saline, test  inflation to 30 cm of water revealed no leakage from the bronchial  stump.  A 28-French chest tube was placed anteriorly and a 36-French  right-angle chest tube was placed posteriorly.  The thoracotomy wound  was closed with 2-0 Vicryl pericostal sutures and 0-0 Vicryl running  suture to close the muscular layers and the subcutaneous tissue and skin  were closed in standard fashion.  Chest tubes were secured with #1 silk  sutures and the remaining port incision was closed with a 0-0 Vicryl  fascial suture and a 3-0 Vicryl subcuticular suture.  There was a  missing tip from an electrocautery unit.  A chest x-ray  was performed in  the operating room after turning the patient to a supine position.  There was no evidence of foreign body.  The patient then was extubated  in the operating room and taken to postanesthetic care unit in critical  but stable condition.      Salvatore Decent Dorris Fetch, M.D.  Electronically Signed     SCH/MEDQ  D:  08/25/2006  T:  08/26/2006  Job:  [id number]   cc:   Marcene Duos, M.D.

## 2010-06-09 NOTE — Letter (Signed)
September 29, 2006   Marcene Duos, M.D.  [phone], [place] [address]   Re:  Joseph Torres, Joseph Torres                DOB:  January 24, 1956   Dear Dr. Delrae Alfred:   I saw Skyeler Smola back in the office today.  As you know, he is a 56-  year-old gentleman who was recently found to have a lung nodule.  He has  a history of heavy tobacco abuse.  We did a left VATS and left upper  lobectomy for what turned out to be a stage 1A adenocarcinoma on July  31st.  Mr. Brunsman is doing very well at this point in time.  I am  please with his recovery.  He does have some pain issues which is not  surprising given his history.  I suspect that will be significant for at  least 6-9 months postoperatively.  He had a stage 1A lesion with  negative nodes, so he does not need adjuvant chemotherapy.  I did try to  schedule him to see Dr. Arbutus Ped from the oncology service just for  completeness sake, but he did not wish to do that at this time.  I will  plan to see Mr. Lipschutz back every 3 months for the first 2 years.  I  will alternate plan chest x-rays and CT scans in terms of his followup  and then after that, we will go to every 56-month visits.  Thank you very  much for allowing me to see Mr. Vosler and participate in his care and  I will continue to keep you informed with our findings as we progress.   Salvatore Decent Dorris Fetch, M.D.  Electronically Signed   SCH/MEDQ  D:  09/29/2006  T:  09/29/2006  Job:  [id number]

## 2010-06-09 NOTE — Assessment & Plan Note (Signed)
OFFICE VISIT   ANUP, BRIGHAM  DOB:  [birth year]                                        September 16, 2009  CHART #:  [id number]   HISTORY:  The patient is a 55 year old gentleman who had a stage I  adenocarcinoma of the left upper lobe resected with a lobectomy in July  2008.  He now returns for his 3-year followup visit.  He was last seen  in the office on February 24, 2009, at which time he was doing well.  He  was still smoking.  At that time, he told me he was smoking 4-5 a week,  now he tells me he is smoking 4-5 cigarettes a day at the present time.  His wife continues to smoke.  In the past, he had smoked as much as 5  packs a day.  In December, he had an admission with congestive heart  failure and possibly a virus.  He had no evidence of recurrent disease  at that time.  His biggest issue currently from symptom standpoint is  his chronic back and neck pain.  His OxyContin was not really helping  him with his pain.  He has now been started on Opana 40 mg for his pain.  He is also taking Zoloft and Klonopin and Robaxin.  He has not had any  trouble with his breathing.  He has not had any hemoptysis and overall  other than his pain issues he says he feels well.   Past medical history significant for lung cancer stage IA adenocarcinoma  resected in 2008, COPD, tobacco abuse, chronic pain, cervical  diskectomy, median nerve palsy, chronic anxiety.   CURRENT MEDICATIONS:  1. Robaxin 75 mg 4 times daily.  2. Klonopin 0.5 mg 3 times daily.  3. Multivitamin once daily.  4. Nebulizer p.r.n. which he has not been using recently.  5. Flomax 0.4 mg daily.  6. Wellbutrin 150 mg daily.  7. Zoloft daily.  8. Opana 40 mg.   ALLERGIES:  PENICILLIN, CODEINE, and ASPIRIN.   REVIEW OF SYSTEMS:  See HPI, otherwise unremarkable.   PHYSICAL EXAMINATION:  The patient is a thin 55 year old white male in  no acute distress.  Blood pressure is 115/79, pulse 75,  respirations 18,  his oxygen saturation is 96% on room air.  He has limited range of  motion in his neck.  HEENT exam is unremarkable.  Neck has well-healed  surgical scars.  He has no palpable cervical or supraclavicular  adenopathy.  Lungs have diminished breath sounds bilaterally, more so on  the left base.  His incisions are well healed.  Cardiac exam has regular  rate and rhythm.  Normal S1 and S2.  No rubs, murmurs, or gallops.   DIAGNOSTIC TESTS:  CT of the chest shows postoperative changes.  There  is no change in his small bilateral adrenal adenomas.  There is no  evidence of recurrent disease.   IMPRESSION:  The patient is a 55 year old gentleman who is 3 years out  now from a left upper lobectomy for stage IA adenocarcinoma.  He has no  evidence of recurrent disease at this time.  We will continue to follow  him for 5 years.  I once again counseled him at length regarding the  importance of complete smoking cessation.  He does remain at  risk for  second primary recurrence as he continues to smoke.  He understands the  issues involved and is on medical treatment with Wellbutrin but has not  been able to quit completely.   Salvatore Decent Dorris Fetch, M.D.  Electronically Signed   SCH/MEDQ  D:  09/16/2009  T:  09/17/2009  Job:  [id number]   cc:   Marcene Duos, M.D.

## 2010-06-09 NOTE — Assessment & Plan Note (Signed)
OFFICE VISIT   KRISHAY, FARO  DOB:  07-31-1955                                        September 29, 2006  CHART #:  [id number]   Mr. Urbas is a 55 year old gentleman who underwent a left upper  lobectomy for stage 1A adenocarcinoma on July the 31st. This was a  difficult operation complicated by significant bleeding from pulmonary  arterial branch evulsion. Despite the difficultly of the operation he  really did very well postoperatively. His air leak resolved by  postoperative day #5 and he was discharged the following day. Since that  time he has continued to do well. He says he feels well overall though  he does still have significant incisional pain. This is not surprising  given his chronic pain issues. He has not had any difficultly with his  breathing. He says he can get his incentive spirometer up to 2500 cc. He  has been walking on a regular basis. He has been able to remain  abstinent from tobacco and says he really does not have any cravings as  long as he has got the Wellbutrin. His pain is managed through the pain  clinic with OxyContin, as well as intermittent oxycodone. He states that  he is having to take the supplemental oxycodone on a regular basis.   On physical exam, Mr. Esau is a 55 year old white male in no acute  distress. His blood pressure is 111/79, pulse is 94, respirations are  18, his oxygen saturation is 95% on room air. LUNGS: Clear. He has  diminished breath sounds at the left base but no rales or wheezing.  CARDIAC EXAM: Regular rate and rhythm, normal S1 and S2. Incisions are  healing well.   LABORATORY DATA:  His chest x-ray shows volume loss on the left side  secondary to the left upper lobectomy. There is good expansion and  filling of the space with the left lower lobe.   IMPRESSION:  Mr. Hagan is doing well now. He is 1 month out from  surgery and recovering really better than I expected. He does have  some  pain issues but again that is less than I expected it probably would be.  I did not give him any pain medication and have told him that all of his  pain medication needs should be handled through the pain clinic to avoid  polypharmacy. I did give him a prescription for additional Wellbutrin  150 mg tablets 1 p.o. b.i.d. I gave him 60 tablets with 3 refills. I  also advised him to continue to exercise and once again strongly  emphasized permanent smoking cessation. I did offer to set up an  appointment to see Dr. Arbutus Ped just from an oncologic standpoint to  possibly discuss the selenium yeast trial but he does not need adjuvant  chemotherapy as he had stage 1A disease. He does not wish to do that at  this time. We will plan to see Mr. Holcomb back on a 3 month basis for  the first 2 years, after that we will go to every 6 months.   Salvatore Decent Dorris Fetch, M.D.  Electronically Signed   SCH/MEDQ  D:  09/29/2006  T:  09/29/2006  Job:  [id number]   cc:   Marcene Duos, M.D.

## 2010-06-09 NOTE — Discharge Summary (Signed)
NAMEAUBERT, CHOYCE              ACCOUNT NO.:  [id number]   MEDICAL RECORD NO.:  [id number]          PATIENT TYPE:  INP   LOCATION:  2631                         FACILITY:  [facility]   PHYSICIAN:  Marinda Elk, M.D.DATE OF BIRTH:  1955/12/15   DATE OF ADMISSION:  05/25/2007  DATE OF DISCHARGE:  05/26/2007                               DISCHARGE SUMMARY   DISCHARGE MEDICATIONS:  1. Spiriva 18 mcg daily.  2. Percocet 10/325 mg p.o. b.i.d.  3. OxyContin 40 mg p.o. t.i.d.  4. Avelox 400 mg for 10 more days.  5. Lyrica 100 mg daily.  6. Celexa 200 mg b.i.d.  7. Albuterol 2 puffs p.r.n.  8. Tylenol 500 mg every 6 hours for fevers.   DISCHARGE DIAGNOSES:  1. Pneumonia.  2. Delirium.  3. Chronic obstructive pulmonary disease.  4. Chronic pain syndrome.   PROCEDURES PERFORMED:  1. CT scan that showed left basilar bronchopneumonia.  2. A 2-D echo that showed no pleural effusion and no wall motion      abnormality.  3. Echo that showed the ventricular systolic function was hyperdynamic      with an EF of 75% and no wall motion abnormality.  There was some      pseudonormal left ventricular pattern filling.   CONSULTANTS:  None.   BRIEF ADMITTING H AND P:  This is a 55 year old with multiple medical  problems including left upper lobe adenocarcinoma stage IA on July 2008,  presents to the ED with acute onset of chest pain and severe shortness  of breath that started 1 day prior to admission, is located on the chest  and abdomen, nonradiating, 10/10 with difficulty breathing and some  cough with some sputum production, white in color.  No hemoptysis.  He  had no history of traveling.  Per his wife, he has been slightly  confused.   PHYSICAL EXAM:  VITAL SIGNS:  His temp is 98, blood pressure 157/102,  pulse 94, and respiration rate 24.  He was sating 100% on 4 L.  GENERAL:  He appeared little bit agitated.  NECK:  Supple.  RESPIRATION:  Good air movement with a left  thoracotomy scar on the  back, but clear to auscultation.  CARDIOVASCULAR:  Regular rate and rhythm, S1 and S2, and positive  tachycardia.  GI:  Positive bowel sounds, nontender, and nondistended.  EXTREMITIES.  Positive pulses and no edema.  NEURO:  He was nonfocal.  He looked a little bit agitated, anxious, and  somewhat confused, but oriented to place and person only.   His labs on admission were sodium 144, potassium 3.6, chloride 110,  bicarb 25, BUN 15, creatinine 0.9, glucose 115, bilirubin 0.5, alkaline  phosphate 88, CK 22, ALT 19, protein 7.2, and albumin 4.0.  White count  is 11.4, ANC 7.6, hemoglobin 13.9, MCV of 102, and platelets 229,000.  Cardiac enzymes were negative x3.  Lipase was 20.  Lactic acid was 1.9.  PTT 32, PT and INR 13/1.0.  CT angio of the chest showed a scar of left  upper lobectomy with some ground glass appearance on the  left for  questionable bronchopneumonia less likely a pulmonary nodule.  UDS was  positive for opiates and marijuana.   HOSPITAL COURSE:  1. Pneumonia.  The patient was started on Avelox and Tylenol for      fever.  Blood culture were taken.  Sputum culture were obtained,      which are pending.  Due to his chest pain, a CT angio was done to      rule out any aortic dissection.  The CT scan of the chest was done,      and aortic dissection was ruled out because of his history.  2. Altered mental status.  This was thought to be secondary to      pneumonia.  All his electrolytes were okay.  He did not show signs      of sepsis.  His vitals remained stable throughout his hospital      stay.  His altered mental status resolved by the next day, and the      patient was eating and not complaining of any more chest pain.      This was thought to be secondary to his pneumonia.  He is told to      follow up with his primary care physician.  3. Chronic pain.  We continued his current medications.  The patient      was taking p.o. throughout his  hospital stay.  We did not think      this was withdrawal.  4. COPD.  No changes were made to his medication.   On the day of discharge, his vitals were temperature 99, blood pressure  133/83, pulse of 100, and respiration was 20.  He was sating 99% on room  air.  Lab wise, his sodium was 144, potassium 3.6, chloride 113, bicarb  24, glucose 126, BUN 7, and creatinine 0.8.  His white count remained  stable.  Aspirin level was negative.  Tylenol level was negative.  Vitamin B12 was 240.  Urine antigen for Legionella was negative.  Urine  antigen for pneumococcus was negative.  Alcohol level was negative.  UA  was negative.      Marinda Elk, M.D.  Electronically Signed     AF/MEDQ  D:  05/26/2007  T:  05/27/2007  Job:  [id number]   cc:   Melvern Banker

## 2010-06-09 NOTE — Assessment & Plan Note (Signed)
OFFICE VISIT   Joseph Torres, Joseph Torres  DOB:  10-19-1955                                        December 08, 2007  CHART #:  [id number]   The patient is a 55 year old gentleman, who is status post left upper  lobectomy for a stage IA adenocarcinoma in July 2008.  He was seen in  the office most recently in August for his 1-year followup at which  time, he had a PET scan which showed no evidence of recurrent disease.  There was some increased activity in his colon.  He has since had a  colonoscopy which revealed some precancerous polyps.  He now returns for  a 49-month followup visit.  He states in the interim, he has had some  neck surgery because of a pinched nerve and he may need to have some  surgery on his left hand.  He has been using some breathing treatments  at home, but only on as-needed basis and he has not had any difficulty  with his breathing.   His current medications are Lyrica 100 mg 4 times daily, OxyContin 40 mg  b.i.d., Robaxin 75 mg q.i.d., Klonopin 0.5 mg t.i.d.  He  has been  started on Flomax 0.4 mg daily.  He uses nebulizers p.r.n.   No change in his family or social history.   REVIEW OF SYSTEMS:  See HPI.  No new headaches or visual changes.  No  new bone or joint pain.  He has chronic neck pain.  Urinary hesitancy  for which he was started on Flomax.   PHYSICAL EXAMINATION:  GENERAL:  The patient is a 55 year old white male  in no acute distress.  VITAL SIGNS:  His blood pressure is 140/90, pulse 91, respirations 18,  his oxygen saturation is 97% on room air.  LUNGS:  Clear with equal breath sounds.  NECK:  There is no cervical, supraclavicular, axillary, or epitrochlear  adenopathy.  CARDIAC:  Regular rate and rhythm.  Normal S1 and S2.   Chest x-ray shows no evidence of recurrent disease.   IMPRESSION:  The patient is now 15 months out from left upper lobectomy  for stage IA non-small cell carcinoma.  He has had no evidence  of  recurrent disease.  We will plan to see him back in 3 months with a CT  of the chest.  I will continue to see him at 63-month intervals for the  first 2 years after that we will go to every 6 months.  The exciting  news that they found these colonic polyps early, we were able to extract  with the colonoscopy and did not require surgery.  Hopefully, he will  get some relief with his hand from the surgery that planned in [facility].  Again, I will plan to see him back in 3 months.  No medication  changes were made at this time.   Salvatore Decent Dorris Fetch, M.D.  Electronically Signed   SCH/MEDQ  D:  12/08/2007  T:  12/09/2007  Job:  [id number]   cc:   Marcene Duos, M.D.

## 2010-06-09 NOTE — Discharge Summary (Signed)
NAMEEGYPT, Joseph Torres              ACCOUNT NO.:  [id number]   MEDICAL RECORD NO.:  [id number]          PATIENT TYPE:  INP   LOCATION:  2034                         FACILITY:  [facility]   PHYSICIAN:  Salvatore Decent. Dorris Fetch, M.D.DATE OF BIRTH:  02-28-1955   DATE OF ADMISSION:  08/25/2006  DATE OF DISCHARGE:                               DISCHARGE SUMMARY   FINAL DIAGNOSIS:  Left upper lobe nodule, stage I A adenocarcinoma.   HOSPITAL DIAGNOSIS:  Acute blood loss anemia postoperatively.   SECONDARY DIAGNOSES:  1. Tobacco abuse.  2. COPD.  3. Rotator cuff surgery 1996 right arm.  4. 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  5. Cervical spine diskectomy effusion in 1997.  6. Chronic pain.  7. Chronic anxiety.   IN-HOSPITAL OPERATIONS AND PROCEDURES:  Bronchoscopy.  The left video-assisted thoracoscopic surgery with left  thoracotomy, left upper lobectomy with mediastinal lymph node  dissection.   HISTORY AND PHYSICAL AND HOSPITAL COURSE:  The patient is a 55 year old  gentleman with a history of tobacco abuse he underwent April of this  year.  CT scan done at that time showed tree-in-bud pattern in the right  upper lobe consistent with pneumonia.  Results were 9 mm ill-defined and  sitting in the left upper lobe anterior medially.  This was felt to  possibly represent scarring.  Those were recommended a repeat scan be  done.  He recently underwent repeat CT scan and this showed an increase  in size in the nodules.  He also had bilateral renal nodules which  appeared to be benign adenoma by CT criteria.  The patient was seen and  evaluated by Dr. Dorris Fetch.  Dr. Dorris Fetch ordered a CT PET scan  which showed increased uptake in the left upper lobe nodule with an FDG  of 3.6.  The lesion was 1.4 x 0.77 meters in size.  There was no adrenal  gland increase in metabolic activity.  There was also no abnormal uptake  noted elsewhere in the body.  Dr. Dorris Fetch discussed with  the patient  undergoing left back with a left upper lobectomy.  He discussed risks  and benefits with the patient.  The patient acknowledged understanding  and agreed to proceed.  Surgery was scheduled for August 25, 2006.  For  details of the patient's past medical history and physical exam please  see dictated H&P.   The patient was taken to the operating room August 25, 2006, where he  underwent bronchoscopy with left video-assisted thoracoscopic surgery,  left thoracotomy and left upper lobectomy with mediastinal lymph node  dissection.  Please see operative note for complication.  The patient  was transferred to intensive care unit in stable condition.  Postoperatively, the patient was hemodynamically stable and extubated.  He was alert and oriented x4.  The patient's postoperative course from  pulmonary standpoint:  Chest x-ray was stable with no pneumothorax.  The  patient did have a small air leak postoperatively.  Chest tube was  placed to water seal on postop day #2.  At this time, air leak had  resolved.  Anterior chest tube  was discontinued following day.  On  postop day #4, small air leak was noted.  Chest tube remained in.  Postop day #5, chest x-ray was stable, air leak resolved and remaining  chest tube was discontinued.  Follow-up chest x-ray showed no  pneumothorax.  During the patient's postoperative course, he did have  acute blood loss anemia..  This was monitored closely and did remain  stable.  The patient's vital signs were followed closely.  The patient  was afebrile.  He was able to be weaned off oxygen sating greater than  90% on room air.  Postoperatively, the patient was out of bed ambulating  well.  He was tolerating diet well.  No nausea, vomiting noted.   Patient is tentatively ready for discharge home in the a.m. August 31, 2006.   FOLLOW-UP APPOINTMENTS:  Follow-up appointment has been arranged with  Dr. Dorris Fetch for September 29, 2006 at 11:15 a.m..   The patient will  need to obtain PMI chest x-ray 30 minutes prior to this appointment.   DISCHARGE INSTRUCTIONS:  1. Activity: Patient instructed no driving until released to do so, no      heavy lifting over 10 pounds.  He is told to ambulate two to four      times per day,  progress as tolerated and continue his breathing      exercises.  2. Incisional care: The patient was told to shower washing his      incisions using soap and water.  He is contact the office if he      develops any drainage or openings from any of his incision sites.  3. Diet: The patient again on diet to be low-fat, low-salt.  The      patient also educated to stop smoking.   DISCHARGE MEDICATIONS:  1. Lyrica 600 mg four times a day.  2. OxyContin 40 mg t.i.d.  3. Celebrex 200 mg b.i.d..  4. Robaxin 750 mg q.i.d. .  5. Klonopin 0.5 mg t.i.d.  6. Ambien CR 25 mg p.r.n..  7. Wellbutrin 250 mg b.i.d..  8. OxyIR 5 mg 1-2 tablets q. 4-6 p.r.n. pain.      Theda Belfast, PA      Salvatore Decent. Dorris Fetch, M.D.  Electronically Signed    KMD/MEDQ  D:  08/30/2006  T:  08/31/2006  Job:  [id number]   cc:   Salvatore Decent. Dorris Fetch, M.D.

## 2010-06-09 NOTE — Assessment & Plan Note (Signed)
OFFICE VISIT   Joseph Torres, Joseph Torres  DOB:  [birth year]                                        February 24, 2009  CHART #:  [id number]   The patient is a 55 year old gentleman followed in the office since  undergoing resection of a stage I adenocarcinoma in July 2008.  This was  removed with a left upper lobectomy.  He has a history of heavy tobacco  abuse and had basically quit smoking, but now says that over the past  couple of months he has been smoking, maybe 4-5 cigarettes a week,  previously had smoked as much as 5 packs a day.  In December, he had  multiple chest x-rays and CT scans.  He may have had some heart failure,  had some pericardial effusion, pericardial thickening, also had a left  pleural effusion.  He said they told him he may have had a virus.  A  followup CT a couple of weeks ago showed resolution of the left pleural  effusion.  He did have still very small pericardial effusions and  pericardial thickening.  Apparently, he was also having congestive heart  failure at that time as well.  Those symptoms have improved.  He is  continue to have problems with chronic pain.  He is taking OxyContin 40  mg p.o. t.i.d. as well as oxycodone 15 mg 3 times daily on a regular  basis.   REVIEW OF SYSTEMS:  Chronic neck and back pain, unchanged; shortness of  breath and weight gain, improved; cough, which is persistent, this has  been nonproductive; no hemoptysis.  Does have depression, nervousness,  arthritis, and occasional wheezing.  Did have peripheral edema back in  December, which is resolved.  All other systems are negative.   Family medical history and social history are reviewed.   On physical examination, the patient is a 55 year old white male, who is  anxious.  His blood pressure was 117/77, pulse 80, respirations are 18,  and his oxygen saturation is 97% on room air.  There is no cervical,  supraclavicular, axillary, or epitrochlear  adenopathy.  His cardiac exam  has regular rate and rhythm.  Normal S1 and S2.  There is no rub.  His  lungs have equal breath sounds bilaterally.   Chest x-ray from today shows chronic volume loss on the left side from  the lobectomy.  No active disease.  CT scan from February 05, 2009, is  reviewed, it shows no evidence of recurrent disease.  Other findings as  previously noted.   IMPRESSION:  The patient is a 55 year old gentleman with a history of  heavy tobacco abuse.  He is now 2-1/2 years out from left upper  lobectomy for stage IA adenocarcinoma with no signs of recurrent  disease.  I was a little disappointed to hear that he had been smoking a  little bit, but at least he was honest about it and it sounds like a  very small amount.  He is taking Wellbutrin.  He had asked for Chantix,  but Dr. Delrae Alfred did not think that was a good idea given his other  medications.  He continues to have chronic pain that is really a major  component of his troubles, that is being managed by Dr. Delrae Alfred as  well.  I am going to plan to  see him back in about 6 months, that will  be a 3-year followup visit.  We will do a CT of the chest at that time.  No medication changes were made today.   Salvatore Decent Dorris Fetch, M.D.  Electronically Signed   SCH/MEDQ  D:  02/24/2009  T:  02/25/2009  Job:  [id number]   cc:   Marcene Duos, M.D.

## 2010-06-09 NOTE — Discharge Summary (Signed)
NAMEMACEDONIO, SCALLON              ACCOUNT NO.:  [id number]   MEDICAL RECORD NO.:  [id number]          PATIENT TYPE:  INP   LOCATION:  2626                         FACILITY:  [facility]   PHYSICIAN:  Madaline Guthrie, M.D.    DATE OF BIRTH:  13-Jun-1955   DATE OF ADMISSION:  05/30/2007  DATE OF DISCHARGE:  06/05/2007                               DISCHARGE SUMMARY   PRIMARY CARE PHYSICIAN:  Marcene Duos, MD, [facility].   DISCHARGE DIAGNOSES:  1. Ventilator-dependent respiratory failure secondary to adult      respiratory distress syndrome, secondary to sepsis.  2. Pneumonia.  3. Acute renal failure.  4. Anemia with macrocytosis.  5. Nausea, vomiting, and diarrhea, possible viral etiology.  6. Shock Liver  7. History of left lung upper lobe adenocarcinoma stage IA status post      left thoracotomy.  8. History of chronic obstructive pulmonary disease.  9. History of chronic pain.  10.History of anxiety disorder  11.Status post rotator cuff surgery in the right arm.  12.History of left elbow surgery for median nerve palsy.  13.History of cervical spine diskectomy.  14.Hypokalemia.  15.Paroxysmal supraventricular tachycardia.   DISCHARGE MEDICATIONS:  1. Levaquin 500 mg once a day for 7 days.  2. Doxycycline 100 mg 2 times a day for 7 days.  3. Albuterol  MDI 2 puffs every 6 hours as required.  4. Diltiazem 240 mg p.o. daily.  5. OxyContin 40 p.o. t.i.d.  6. Spiriva 18 mcg daily.  7. Lyrica 100 mg t.i.d.   DISPOSITION AND FOLLOWUP:  The patient is sent home with his wife in a  stable condition.  The patient will follow with Dr. Delrae Alfred at the  [facility].  A followup appointment has been arranged for  Friday, Jun 09, 2007.  At the followup visit, the patient is to be  reviewed for resolution of his pneumonia.  Also, the patient will  require long-term follow-up in terms for his problem of lung cancer.   STUDIES:  Ultrasound of the abdomen on May 31, 2007.  Impression:  Gallbladder sludge.  Wall thickness upper limits of normal, trace  amounts of pericholecystic fluid.  No sonographic Murphy sign to confirm  acute cholecystis.   CONSULTS:  No special consults were requested during this  hospitalization.   BRIEF ADMISSION HISTORY AND PHYSICAL:  Mr. Trickett is a 55 year old  white man with a history of stage IA adenocarcinoma of the lung status  post lobectomy.  He had recently been discharged from the hospital 4  days prior to admission following an admission for community-acquired  pneumonia.  The patient reported that he had progressive shortness of  breath ever since discharge from the hospital.  When he presented this  time, he was complaining of dyspnea even on walking to the bathroom.  It  was associated with fever and chills.  He also mentioned of productive  cough with yellowish-brown sputum.  He denied any chest pain.  Two days  prior to admission, the patient had developed vomiting.  He vomited 7  times, mostly food particles and no blood.  It was associated with  diarrhea that was watery, sometimes black and this was noted after the  patient took Pepto-Bismol.  The patient apparently was seen by his PCP  on the day of admission who noted him to be having a very low blood  pressure and suspected him of GI bleeding too and hence sent him to  [facility] for further evaluation.   PHYSICAL EXAMINATION:  VITAL SIGNS:  Temperature 97.7, blood pressure  111/68, pulse 94, respiratory rate 18, and O2 sat 60% on room air, 100%  nonrebreathing bag-valve-mask.  GENERAL:  The patient was lying  on bed with nonrebreathing bag-valve-  mask on his face, and he was attempting to pull it off.  EYE:  PERRLA.  EOMI.  ENT:  Oropharynx is clear.  False teeth in upper jaw.  NECK:  Supple.  No cervical adenopathy.  CHEST:  Clear and equal bilaterally with good air movement, right-sided  rhonchi audible.  CARDIOVASCULAR:  Regular rate and  rhythm.  No murmurs, rubs, or gallops.  ABDOMEN:  Soft, but tender in epigastric region, right upper quadrant,  and left upper quadrant region with positive guarding.  No rigidity.  Normal bowel sounds.  EXTREMITIES:  No edema.  No calf tenderness.  SKIN:  Dry erythematous bilateral arm.  NEURO:  Awake and alert, oriented to person and place only and grossly  nonfocal.   ADMISSION LABS:  Sodium 140, potassium 3.3, chloride 102, bicarbon 24,  BUN 52, creatinine 2.59, blood glucose 101, bilirubin 1.6 with indirect  1.1, alk phos 173, AST 9939, ALT 5905, protein 6.6, albumin 2.9, and  calcium 7.5.  WBC 16.3, ANC 14.7, hemoglobin 11.5, MVC 100.4, and  platelet 295.  FOBT negative.  Blood gas; pH 7.46, PCO2 31, PO2 55,  bicarbonate 22, and d-dimer 17.  Urinalysis; large blood, INR 2.1, PT  24.3, alcohol 8, Tylenol less than 10, salicylate less than 4, and  lactic acid 3.3.  BNP 1539.  Troponin I 0.31 and 0.58.   HOSPITAL COURSE:  1. Acute respiratory failure.  The patient required intubation and was      subsequently admitted to the ICU.  ARDS protocol was initiated.      This was most likely precipitated by pneumonia and sepsis, and the      patient was placed on broad-spectrum antibiotics with vancomycin,      Primaxin, and azithromycin.  The patient received IV antibiotics      for 7 days total.  The patient was extubated successfully after      about 3 days of intubation and breathing support on the ventilator      in the ICU.  After 7 days of treatment with IV antibiotics, the      patient was transferred to oral antibiotics.  Initially, he was      placed on Avelox, but the patient bumped his white cells slightly      after being placed on Avelox and was sent home on Levaquin and      doxycycline to provide broad-spectrum coverage.  The patient's      Avelox was discontinued also because the patient was having liver      failure issues.  2. Sepsis.  As mentioned above, this was most  likely precipitated by      pneumonia possibly due to his pneumonia from earlier admission.      The patient received antibiotics as mentioned above.  Extensive      microbiological studies  were undertaken including blood cultures,      urinalysis, and urine cultures, sputum and BAL culture and      sensitivity including those for fungus, PCP and TB.  So far, the      culture data have all been negative except for CMV immunoglobulin G      positive with immunoglobulin M negative.  As mentioned above, the      patient was treated empirically for pneumonia and sepsis, and he      responded well to the treatment regimen.  He was successfully      extubated after being on the ventilator for three days.  3. Nausea, vomiting, and diarrhea.  This is most likely related to his      generalized sepsis picture and probably related to his chest      infection.  He was symptomatically treated initially with IV      fluids.  He remained hemodynamically stable otherwise responding      with IV fluids, not requiring any pressor supports.  His nausea and      vomiting as well as diarrhea has subsided to a large extent at that      time of discharge. Stool studies were negative. Of note, the C.diff      came out to be negative.  4. Liver dysfunction.  As mentioned on lab results above, the patient      came in with a very highly elevated liver enzymes.  These are most      likely secondary to the septic shock and hypoxia.  It was managed      supportively as mentioned above under problem under problem number      1 and 2.  He responded well and liver enzymes were trending down at      the time of discharge. He also received IV N-Acetylcysteine upon      poison contro's recommendation.  5. Acute renal failure, which  was most likely  prerenal secondary to      the septic process, and he responded well to fluid resuscitation.  6. Anemia with macrocytosis.  His hemoglobin remained stable      throughout the  stay in the hospital, ranging  between 9 to 11.  The      patient most likely has anemia of chronic disease as well as      possible alcohol intake. This will be evaluated on an outpatient      basis.  7. Hypokalemia.  While in the hospital, the patient still had mild      hypokalemia which responded to potassium supplements.   DISCHARGE LABS:  White count 12.9, hemoglobin 10.2, and platelets 160.  Sodium 143, potassium 3.3, chloride 109, bicarbonate 24, glucose 104,  BUN 14, and creatine 1.   DISCHARGE DAY VITALS:  Vitals within normal limits.   Patient was advised to stay one more day to see how will do with the  oral antibiotics, but he insisted on going home and agreed for early  follow-up with his PCP.      Zara Council, MD  Electronically Signed      Madaline Guthrie, M.D.  Electronically Signed    AS/MEDQ  D:  06/05/2007  T:  06/06/2007  Job:  [id number]   cc:   Marcene Duos, M.D.

## 2010-06-09 NOTE — Assessment & Plan Note (Signed)
OFFICE VISIT   Joseph Torres, Joseph Torres  DOB:  [birth year]                                        August 22, 2008  CHART #:  [id number]   The patient is a 55 year old gentleman who had a left upper lobectomy  for stage I adenocarcinoma in July 2008, he now returns for his 2-year  follow up visit.  He was last seen in February, at that time he was not  smoking and his breathing was stable.  He states that since the last  visit, he has had some muscular pain in his right shoulder, he has had  previous rotator cuff repair there but also this summer he has been  having some difficulty with his breathing.  He has been having some more  wheezing and shortness of breath, and it is really consistent with what  he usually has in the summertime with heat and humidity, it is just that  the heat and humidity have been much worse this year.  His weight has  been stable and he continues to remain abstinent from tobacco.   CURRENT MEDICATIONS:  Lyrica 100 mg daily, oxytocin 40 mg daily,  Celebrex 200 mg b.i.d., Robaxin 75 mg daily, Klonopin 0.5 mg t.i.d., he  has a Ventolin inhaler but it is expired, oxycodone 15 mg p.o. b.i.d.  for his chronic pain.   He has allergies to penicillin, codeine and possibly aspirin.   REVIEW OF SYSTEMS:  See HPI, otherwise negative.   PHYSICAL EXAMINATION:  General:  The patient is a 55 year old gentleman  in no acute distress.  Vital Signs:  Blood pressure is 131/82, pulse 92,  respirations 20, oxygen saturation is 96% on room air.  Neck:  He has  limited range of motion of his neck, a well healed surgical scar.  He  has no cervical, subclavicular, axillary, or epitrochlear adenopathy.  Lungs:  Diminished breath sounds bilaterally.  There is no wheezing at  present.  Cardiac:  Regular rate and rhythm.  Normal S1 and S2.  No  murmurs, rubs, or gallops.   CT scan was performed, the report was reviewed.  There is no evidence of  recurrent or  metastatic disease.   IMPRESSION:  The patient is a 55 year old gentleman, he is now 2 years  out from left upper lobectomy for stage IA adenocarcinoma with no  evidence of recurrent disease.  I did inform him that this is a good  sign as the majority of recurrences occur within the first 2 years, but  he does need to continue to be followed for 5 years in total to be  considered cured in terms of this cancer.  Given his extensive smoking  history, he likely will need to follow up after that because of his high  risk for second primary.  I did give the patient a prescription for  Ventolin inhaler with 3 refills 2 puffs 4 times daily as needed.  I will  plan to see him back in 6 months, we will do a chest x-ray at that time  and plan to do another CT scan at the 3-year visit.   Salvatore Decent Dorris Fetch, M.D.  Electronically Signed   SCH/MEDQ  D:  08/22/2008  T:  08/23/2008  Job:  [id number]   cc:   Marcene Duos, M.D.

## 2010-06-09 NOTE — Assessment & Plan Note (Signed)
OFFICE VISIT   Joseph Torres, Joseph Torres  DOB:  [birth year]                                        March 20, 2007  CHART #:  [id number]   REASON FOR VISIT:  Follow-up for lung cancer.   Joseph Torres is a 54 year old gentleman with a history of tobacco abuse  in the past.  He had a left upper lobectomy for stage IA adenocarcinoma  on 08/25/06.  He is now roughly 6-7 months out from surgery and returns  for a follow-up visit.  He states that he has been doing well from a  respiratory standpoint.  His main complaint today is left hand weakness  and pain secondary to carpal tunnel syndrome for which he has been seen  and evaluated.   As noted his breathing has been stable. He has not had any new  neurologic symptoms of headache or visual changes out of the ordinary.  No new rib pain.  In fact his incisional discomfort is very well  controlled.  He states that he is still not smoking.  He has been off  Wellbutrin and does not feel like he needs that anymore as he is no  longer having any cravings.   CURRENT MEDICATIONS:  1. Lyrica 100 mg q.i.d.  2. OxyContin 40 mg q.i.d.  3. Celebrex 200 mg b.i.d.  4. Robaxin 75 mg q.i.d.  5. Klonopin 0.5 mg t.i.d.  6. Percocet p.r.n.   ALLERGIES:  He is allergic to penicillin and codeine and has a  questionable aspirin allergy.   PHYSICAL EXAMINATION:  GENERAL:  Joseph Torres is a 55 year old gentleman  in no acute distress.  VITALS:  Blood pressure 120/90, pulse 92, respirations 18, his oxygen  saturation is 96% on room air.  NEUROLOGICAL:  He is alert and oriented, he is appropriate.  Does have  some weakness in his left arm at baseline.  LUNGS:  Diminished breath sounds at the left base otherwise clear.  CARDIAC EXAM:  Regular rate and rhythm, normal S1/S2.  No rubs, murmurs  or gallops.  There is no cervical, supraclavicular or axillary,  epitrochlear  adenopathy.  SKIN:  This thoracotomy incision and chest tube sites  are well healed.   Chest x-ray today shows postoperative changes consistent with a previous  lobectomy.  There is no evidence of recurrent disease.   IMPRESSION:  Joseph Torres is a 55 year old gentleman who is now about 7  months out from left upper lobectomy for stage IA cancer.  He refused to  see oncology regarding any kind of postoperative adjuvant treatment.  We  are currently following him for any signs of recurrence. There is no  evidence of recurrent disease at the present time.  I did encourage him  to get his carpal tunnel issue fixed as soon as possible.  There is  nothing from a medical standpoint, from my standpoint that would delay  that operation.   Secondly, regarding his lung cancer, will plan to see him back in 4-5  months with a CT of the chest for his one year follow-up.   Third, Joseph Torres remains tobacco free and says that he is not having  any cravings or any desire to start smoking again.   Salvatore Decent Dorris Fetch, M.D.  Electronically Signed   SCH/MEDQ  D:  03/20/2007  T:  03/20/2007  Job:  [id number]   cc:   Marcene Duos, M.D.

## 2010-06-09 NOTE — Op Note (Signed)
Joseph Torres, Joseph Torres              ACCOUNT NO.:  [id number]   MEDICAL RECORD NO.:  [id number]          PATIENT TYPE:  AMB   LOCATION:  SDS                          FACILITY:  [facility]   PHYSICIAN:  Vanita Panda. Magnus Ivan, M.D.DATE OF BIRTH:  05-Feb-1955   DATE OF PROCEDURE:  07/12/2006  DATE OF DISCHARGE:  07/12/2006                               OPERATIVE REPORT   PREOPERATIVE DIAGNOSES:  1. Left carpal tunnel syndrome.  2. Left cubital tunnel syndrome.   POSTOPERATIVE DIAGNOSES:  1. Left carpal tunnel syndrome.  2. Left cubital tunnel syndrome.   PROCEDURES:  1. Left open carpal tunnel release.  2. Left ulnar nerve decompression.  3. Left ulnar nerve submuscular transposition.   SURGEON:  Doneen Poisson, MD   ANESTHESIA:  General.   ANTIBIOTICS:  600 mg IV clindamycin.   TOURNIQUET TIME:  1 hour 4 minutes.   COMPLICATIONS:  None.   INDICATIONS:  Briefly [name] is a 55 year old with severe carpal  tunnel and cubital tunnel syndrome that had been verified by nerve  conduction studies.  These were involving his left arm.  He had the  point of severe numbness and tingling and even muscle atrophy in his  hand in intrinsic musculature.  He has severe kyphosis of the cervical  spine and he is going to have some type of cervical decompression at a  later date but the spine surgeon wanted him to proceed with addressing  the peripheral nerve symptoms due to the severity of these.  The risks,  benefits of this were explained to the patient, well understood.  He  agreed to proceed with surgery.   DESCRIPTION OF PROCEDURE:  After informed consent obtained and  appropriate left arm was marked, he was brought to the operating room,  placed supine on the operating table.  General anesthesia was then  obtained.  His arm was prepped and draped up from the fingers up to the  axilla with DuraPrep and sterile drapes.  A sterile tourniquet placed  around the arm.  The arm was  wrapped out with an Esmarch and tourniquet  inflated to 250 mm of pressure.  A time-out was called and he was  identified as the correct patient, correct left extremity and then with  his arm on an arm table, an open carpal tunnel approach was taken.  Made  incision in the palm of his hand and carried this down to the distal  margin of the transverse carpal ligament with meticulous dissection,  protecting the nerve and the structures.  Below the carpal ligament I  was able to excise the carpal ligament and transverse carpal ligament in  its entirety.  I then explored the nerve and found significant  tenosynovitis in his hand as well as the nerve being intact in it motor  branch.  After this was done, I copiously irrigated this wound and  closed the skin with interrupted 3-0 nylon suture.  Next, I turned my  attention to the ulnar nerve.  I could easily palpate in his thin arm,  the medial epicondyle of the distal humerus and the olecranon.  I then  made incision centered directly over the medial epicondyle and extended  this proximally and distally.  Meticulous dissection was carried out to  assess the ulnar nerve and I was able to find the ulnar nerve.  I freed  it up at its entry into the cubital tunnel as well as at the two heads  of flexor carpi ulnaris distally.  Proximally I was able to free the  ligament of Struthers as well as the intermuscular septum.  Once the  nerve was easily free and mobile, I moved it to an anterior position.  Next I was able to make a subfascial and submuscular flap and with this  flap was able to control the nerve to keep it from snapping back into  its groove.  The nerve was able to be easily seen with flexion/extension  gliding easily in the fascial and subfascial, submuscular sling.  The  wound was then copiously irrigated at the elbow and  I closed the deep  tissue with interrupted 2-0 Vicryl suture followed by 2-0 Vicryl  subcutaneous tissue and  interrupted 3-0 nylon on the skin.  Both the  elbow incision and hand incision were infiltrated with quarter percent  plain Marcaine.  Xeroform followed by well-padded sterile dressing and a  posterior plaster splint were placed from proximal to the elbow down the  level of the wrist.  The tourniquet was let down at 1 hour 4 minutes,  the fingers did pink nicely.  The patient was awakened, extubated, taken  to recovery room in stable condition.  There no complications noted.      Vanita Panda. Magnus Ivan, M.D.  Electronically Signed     CYB/MEDQ  D:  07/12/2006  T:  07/13/2006  Job:  [id number]

## 2010-06-09 NOTE — Assessment & Plan Note (Signed)
OFFICE VISIT   Joseph Torres, Joseph Torres  DOB:  May 19, 1955                                        July 21, 2007  CHART #:  [id number]   The patient is a 55 year old gentleman who had a left video-assisted  upper lobectomy in July of last year.  He was last seen in the office in  February, at which time he was making good progress.  Since that time,  he was admitted to the hospital apparently with bilateral pneumonia  required ventilator support.  During that time, he missed a followup  visit and schedule this visit today.   He states that he is doing well.  He is still weak from the recovery  phase from his double pneumonia.  He has not had any acute problems with  his breathing.  He states he remains abstinent from tobacco.  He still  has a lot of chronic pain issues related to previous neck and back  problems but nothing new in that regard since his last visit.   PHYSICAL EXAMINATION:  GENERAL:  The patient is a 55 year old white male  in no acute distress.  VITAL SIGNS:  Blood pressure 94/69, pulse 102, respirations were 18, and  oxygen saturations 98% on room air.  LUNGS:  Clear with diminished breath sounds bilaterally.  LYMPHATIC:  There is no cervical or subclavicular adenopathy.   The patient did not have a CT scan today.  He has had a head CT about 2  weeks ago, which showed no evidence of metastatic disease.  It was done  for other reasons.  He had a CT of his chest done back in April, it was  done to rule out aortic dissection.  There was no evidence of aortic  dissection.  He did have some ground-glass opacities at the base of his  left lower lobe, which were felt likely related to infection.   IMPRESSION:  The patient is now about 11 months out from surgery with no  evidence of recurrent disease.  I will see him back in one month for his  1-year followup visit.  We will do a PET CT at that time.   Salvatore Decent Dorris Fetch, M.D.  Electronically  Signed   SCH/MEDQ  D:  07/21/2007  T:  07/21/2007  Job:  [id number]   cc:   Marcene Duos, M.D.

## 2010-06-12 NOTE — Op Note (Signed)
[facility]  Patient:    Joseph Torres, Joseph Torres Visit Number: [id number] MRN: [id number]          Service Type: DSU Location: [facility] Attending Physician:  Marlowe Shores Dictated by:   Artist Pais Mina Marble, M.D. Proc. Date: 07/12/01 Admit Date:  07/12/2001                             Operative Report  PREOPERATIVE DIAGNOSIS: Right thumb stenosing tenosynovitis.  POSTOPERATIVE DIAGNOSIS: Right thumb stenosing tenosynovitis.  OPERATION/PROCEDURE: Right trigger thumb release.  SURGEON: Artist Pais. Mina Marble, M.D.  ASSISTANT: Junius Roads. Ireton, P.A.C.  ANESTHESIA: Local, 1% lidocaine, 0.25% Marcaine block.  TOURNIQUET TIME: Ten minutes.  COMPLICATIONS: None.  DRAINS: None.  PROCEDURE: The patient was the procedure room and the right upper extremity was prepped and draped in the usual sterile fashion.  After this 1% plain lidocaine with 0.25% Marcaine was injected into the area of the A1 pulley of the right thumb.  The tourniquet was then inflated.  The skin was incised over the MP flexion crease and dissection taken down to the area where the A1 pulley was identified and split with a 15 blade, thus freeing up the FPL tendon.  This was lysed of all adhesions.  The wound was thoroughly irrigated and closed with 5-0 nylon in horizontal mattress sutures.  A sterile dressing of Xeroform, 4 x4s, and Coban wrap was applied.  The patient tolerated the procedure well and went to the recovery room in stable fashion. Dictated by:   Artist Pais Mina Marble, M.D. Attending Physician:  Marlowe Shores DD:  07/12/01 TD:  07/13/01 Job: [id number]

## 2010-06-18 ENCOUNTER — Emergency Department (HOSPITAL_COMMUNITY)
Admission: EM | Admit: 2010-06-18 | Discharge: 2010-06-19 | Disposition: A | Discharge status: Home / Self Care | Payer: Medicare Other | Attending: Emergency Medicine | Admitting: Emergency Medicine

## 2010-06-18 DIAGNOSIS — J4489 Other specified chronic obstructive pulmonary disease: Secondary | ICD-10-CM | POA: Insufficient documentation

## 2010-06-18 DIAGNOSIS — R071 Chest pain on breathing: Secondary | ICD-10-CM | POA: Insufficient documentation

## 2010-06-18 DIAGNOSIS — J449 Chronic obstructive pulmonary disease, unspecified: Secondary | ICD-10-CM | POA: Insufficient documentation

## 2010-06-18 DIAGNOSIS — Z902 Acquired absence of lung [part of]: Secondary | ICD-10-CM | POA: Insufficient documentation

## 2010-06-18 DIAGNOSIS — J90 Pleural effusion, not elsewhere classified: Secondary | ICD-10-CM | POA: Insufficient documentation

## 2010-06-19 ENCOUNTER — Emergency Department (HOSPITAL_COMMUNITY): Payer: Medicare Other

## 2010-08-06 ENCOUNTER — Other Ambulatory Visit: Payer: Self-pay | Admitting: Thoracic Surgery (Cardiothoracic Vascular Surgery)

## 2010-08-06 DIAGNOSIS — C3490 Malignant neoplasm of unspecified part of unspecified bronchus or lung: Secondary | ICD-10-CM

## 2010-09-21 ENCOUNTER — Ambulatory Visit: Payer: Medicare Other | Admitting: Thoracic Surgery (Cardiothoracic Vascular Surgery)

## 2010-09-21 ENCOUNTER — Other Ambulatory Visit: Payer: Medicare Other

## 2010-10-12 ENCOUNTER — Encounter: Payer: Self-pay | Admitting: Thoracic Surgery (Cardiothoracic Vascular Surgery)

## 2010-10-12 DIAGNOSIS — C801 Malignant (primary) neoplasm, unspecified: Secondary | ICD-10-CM

## 2010-10-12 DIAGNOSIS — J449 Chronic obstructive pulmonary disease, unspecified: Secondary | ICD-10-CM

## 2010-10-20 LAB — PROTIME-INR
INR: 1
Prothrombin Time: 13.7

## 2010-10-20 LAB — CROSSMATCH: ABO/RH(D): O POS

## 2010-10-20 LAB — CBC
HCT: 40.4
MCV: 102.5 — ABNORMAL HIGH
Platelets: 228
RBC: 3.95 — ABNORMAL LOW
WBC: 11.4 — ABNORMAL HIGH

## 2010-10-20 LAB — URINALYSIS, ROUTINE W REFLEX MICROSCOPIC
Bilirubin Urine: NEGATIVE
Glucose, UA: NEGATIVE
Hgb urine dipstick: NEGATIVE
Specific Gravity, Urine: 1.028
pH: 5

## 2010-10-20 LAB — LEGIONELLA ANTIGEN, URINE: Legionella Antigen, Urine: NEGATIVE

## 2010-10-20 LAB — POCT I-STAT 3, ART BLOOD GAS (G3+)
Acid-base deficit: 3 — ABNORMAL HIGH
Bicarbonate: 21.8
O2 Saturation: 96
pCO2 arterial: 37.7
pO2, Arterial: 82

## 2010-10-20 LAB — POCT I-STAT, CHEM 8
Chloride: 110
Glucose, Bld: 117 — ABNORMAL HIGH
HCT: 44
Hemoglobin: 15
Potassium: 3.6
Sodium: 144

## 2010-10-20 LAB — DIFFERENTIAL
Eosinophils Absolute: 0.1
Eosinophils Relative: 1
Lymphocytes Relative: 22
Lymphs Abs: 2.5
Monocytes Relative: 10
Neutrophils Relative %: 67

## 2010-10-20 LAB — POCT CARDIAC MARKERS
CKMB, poc: <1 — ABNORMAL LOW
Myoglobin, poc: 45.8
Operator id: 272551
Troponin i, poc: <0.05

## 2010-10-20 LAB — CARDIAC PANEL(CRET KIN+CKTOT+MB+TROPI)
CK, MB: 1.9
Relative Index: INVALID
Troponin I: 0.01

## 2010-10-20 LAB — RAPID URINE DRUG SCREEN, HOSP PERFORMED
Amphetamines: NOT DETECTED
Barbiturates: NOT DETECTED
Opiates: POSITIVE — AB

## 2010-10-20 LAB — HEPATIC FUNCTION PANEL
ALT: 19
AST: 22
Alkaline Phosphatase: 80
Indirect Bilirubin: 0.4
Total Protein: 7.2

## 2010-10-20 LAB — STREP PNEUMONIAE URINARY ANTIGEN: Strep Pneumo Urinary Antigen: NEGATIVE

## 2010-10-20 LAB — TSH: TSH: 0.854

## 2010-10-20 LAB — METHYLMALONIC ACID, SERUM: Methylmalonic Acid, Quantitative: 100 nmol/L (ref 87–318)

## 2010-10-20 LAB — BASIC METABOLIC PANEL
CO2: 21
Calcium: 8.1 — ABNORMAL LOW
Chloride: 110
Creatinine, Ser: 0.73
GFR calc Af Amer: >60
Sodium: 139

## 2010-10-20 LAB — CK TOTAL AND CKMB (NOT AT ARMC)
CK, MB: 2.7
Relative Index: INVALID
Total CK: 62

## 2010-10-20 LAB — TROPONIN I: Troponin I: 0.01

## 2010-10-20 LAB — CULTURE, BLOOD (ROUTINE X 2)

## 2010-10-20 LAB — VITAMIN B12: Vitamin B-12: 240 (ref 211–911)

## 2010-10-21 ENCOUNTER — Encounter (HOSPITAL_COMMUNITY): Payer: Self-pay

## 2010-10-21 ENCOUNTER — Inpatient Hospital Stay (HOSPITAL_COMMUNITY)
Admission: EM | Admit: 2010-10-21 | Discharge: 2010-10-23 | DRG: 315 | Disposition: A | Discharge status: Home / Self Care | Payer: Medicare Other | Attending: Internal Medicine | Admitting: Internal Medicine

## 2010-10-21 ENCOUNTER — Emergency Department (HOSPITAL_COMMUNITY): Payer: Medicare Other

## 2010-10-21 DIAGNOSIS — J90 Pleural effusion, not elsewhere classified: Secondary | ICD-10-CM | POA: Diagnosis present

## 2010-10-21 DIAGNOSIS — G8929 Other chronic pain: Secondary | ICD-10-CM | POA: Diagnosis present

## 2010-10-21 DIAGNOSIS — Z85118 Personal history of other malignant neoplasm of bronchus and lung: Secondary | ICD-10-CM

## 2010-10-21 DIAGNOSIS — F411 Generalized anxiety disorder: Secondary | ICD-10-CM | POA: Diagnosis present

## 2010-10-21 DIAGNOSIS — Z880 Allergy status to penicillin: Secondary | ICD-10-CM

## 2010-10-21 DIAGNOSIS — I319 Disease of pericardium, unspecified: Secondary | ICD-10-CM | POA: Diagnosis present

## 2010-10-21 DIAGNOSIS — I300 Acute nonspecific idiopathic pericarditis: Principal | ICD-10-CM | POA: Diagnosis present

## 2010-10-21 DIAGNOSIS — IMO0002 Reserved for concepts with insufficient information to code with codable children: Secondary | ICD-10-CM

## 2010-10-21 DIAGNOSIS — F17200 Nicotine dependence, unspecified, uncomplicated: Secondary | ICD-10-CM | POA: Diagnosis present

## 2010-10-21 DIAGNOSIS — Z79899 Other long term (current) drug therapy: Secondary | ICD-10-CM

## 2010-10-21 DIAGNOSIS — J441 Chronic obstructive pulmonary disease with (acute) exacerbation: Secondary | ICD-10-CM | POA: Diagnosis present

## 2010-10-21 HISTORY — DX: Acquired absence of lung (part of): Z90.2

## 2010-10-21 HISTORY — DX: Chronic obstructive pulmonary disease, unspecified: J44.9

## 2010-10-21 LAB — CBC
HCT: 36.3 % — ABNORMAL LOW (ref 39.0–52.0)
Hemoglobin: 12.3 g/dL — ABNORMAL LOW (ref 13.0–17.0)
MCV: 95.5 fL (ref 78.0–100.0)
RDW: 15.1 % (ref 11.5–15.5)
WBC: 11.6 10*3/uL — ABNORMAL HIGH (ref 4.0–10.5)

## 2010-10-21 LAB — DIFFERENTIAL
Basophils Absolute: 0 10*3/uL (ref 0.0–0.1)
Eosinophils Relative: 3 % (ref 0–5)
Lymphocytes Relative: 13 % (ref 12–46)
Lymphs Abs: 1.5 10*3/uL (ref 0.7–4.0)
Neutro Abs: 8.3 10*3/uL — ABNORMAL HIGH (ref 1.7–7.7)

## 2010-10-21 LAB — POCT I-STAT, CHEM 8
BUN: 15 mg/dL (ref 6–23)
Calcium, Ion: 1.2 mmol/L (ref 1.12–1.32)
HCT: 38 % — ABNORMAL LOW (ref 39.0–52.0)
Sodium: 142 mEq/L (ref 135–145)
TCO2: 23 mmol/L (ref 0–100)

## 2010-10-21 LAB — POCT I-STAT TROPONIN I

## 2010-10-21 MED ORDER — IOHEXOL 300 MG/ML  SOLN
100.0000 mL | Freq: Once | INTRAMUSCULAR | Status: AC | PRN
  Administered 2010-10-21: 100 mL via INTRAVENOUS

## 2010-10-22 ENCOUNTER — Inpatient Hospital Stay (HOSPITAL_COMMUNITY): Payer: Medicare Other

## 2010-10-22 DIAGNOSIS — J90 Pleural effusion, not elsewhere classified: Secondary | ICD-10-CM

## 2010-10-22 DIAGNOSIS — I309 Acute pericarditis, unspecified: Secondary | ICD-10-CM

## 2010-10-22 LAB — CARDIAC PANEL(CRET KIN+CKTOT+MB+TROPI)
CK, MB: 15.6 ng/mL (ref 0.3–4.0)
CK, MB: 11.2 ng/mL (ref 0.3–4.0)
Total CK: 475 U/L — ABNORMAL HIGH (ref 7–232)
Troponin I: <0.3 ng/mL (ref <0.30)

## 2010-10-22 LAB — BASIC METABOLIC PANEL
BUN: 14 mg/dL (ref 6–23)
Calcium: 8.9 mg/dL (ref 8.4–10.5)
Creatinine, Ser: 0.85 mg/dL (ref 0.50–1.35)
GFR calc Af Amer: >60 mL/min (ref >60)
GFR calc non Af Amer: >60 mL/min (ref >60)
Glucose, Bld: 122 mg/dL — ABNORMAL HIGH (ref 70–99)
Potassium: 3.3 mEq/L — ABNORMAL LOW (ref 3.5–5.1)

## 2010-10-22 LAB — CBC
HCT: 33.7 % — ABNORMAL LOW (ref 39.0–52.0)
MCHC: 34.7 g/dL (ref 30.0–36.0)
Platelets: 295 10*3/uL (ref 150–400)
RDW: 14.7 % (ref 11.5–15.5)
WBC: 10.8 10*3/uL — ABNORMAL HIGH (ref 4.0–10.5)

## 2010-10-22 LAB — CK TOTAL AND CKMB (NOT AT ARMC)
CK, MB: 23.8 ng/mL (ref 0.3–4.0)
Relative Index: 4.6 — ABNORMAL HIGH (ref 0.0–2.5)
Total CK: 518 U/L — ABNORMAL HIGH (ref 7–232)

## 2010-10-22 LAB — TROPONIN I: Troponin I: <0.3 ng/mL (ref <0.30)

## 2010-10-22 NOTE — H&P (Signed)
Joseph Torres, Joseph Torres              ACCOUNT NO.:  [id number]  MEDICAL RECORD NO.:  [id number]  LOCATION:  [facility]                         FACILITY:  [facility]  PHYSICIAN:  Gery Pray, MD      DATE OF BIRTH:  1955-07-27  DATE OF ADMISSION:  10/21/2010 DATE OF DISCHARGE:                             HISTORY & PHYSICAL   PCP:  None.  CODE STATUS:  Full code.  The patient goes to team 1.  CHIEF COMPLAINT:  Chest pain.  HISTORY OF PRESENT ILLNESS:  This is a 55 year old male who has been having some mild chest pain for the past few days.  Today, it became quite severe.  It was centrally located, radiated to the right.  It was described as sharp, ranked 10/10.  He states he could not take a deep breath because of the pain.  He became short of breath.  He was wheezing.  He does have a chronic cough, nonproductive.  He has COPD. He is not on home oxygen, but he does use nebulizer.  He states that he has had chills, but no fevers.  No nausea.  No vomiting.  No abdominal pain.  He is not able to quantify if he has had any weight loss, but he says his appetite has been poor.  He has had some increased malaise.  He does not report any lightheadedness or any dizziness.  No palpitation. No presyncope.  Of note, this patient had a history of lung cancer in 2008.  He had a lobectomy.  He states he had no chemotherapy or radiation therapy.  He was told by his doctors that he had lung cancer from smoking.    PAST MEDICAL HISTORY: 1. COPD. 2. Chronic pain. 3. History of lung cancer.  PAST SURGICAL HISTORY:  Includes lumbar fusion and lung resection.  MEDICATIONS:  The patient is on Flexeril, Flomax, Lyrica, Opana, Robaxin, Wellbutrin, and Zoloft.  ALLERGIES:  The patient is allergic to: 1. CODEINE. 2. PENICILLIN.  SOCIAL HISTORY:  Ongoing tobacco use, 3-4 cigarettes a day, negative alcohol or illicit drugs.  He lives at home with his wife.  No home oxygen.  He uses a walker.  He uses a  stabilizer.  He is on disability for chronic pain.  FAMILY HISTORY:  The patient denies diabetes or coronary artery disease.  PHYSICAL EXAMINATION:  VITALS:  Blood pressure 109/77; pulse 111 initially, currently 95; respirations 16; temperature 98.5; satting 98% on room air. GENERAL:  Alert and oriented male in mild discomfort. EYES:  Pink conjunctivae.  PERRLA. ENT:  Moist oral mucosa.  Trachea midline. NECK:  Supple. LUNGS:  Clear to auscultation.  No wheeze appreciated.  No use of accessory muscles. CARDIOVASCULAR:  Regular rate and rhythm without murmurs, regurg, or gallops.  No JVD. ABDOMEN:  Soft, positive bowel sounds.  Some tenderness to palpation. Not an acute surgical abdomen.  No organomegaly. NEURO:  Cranial nerves II through XII grossly intact.  Sensation intact. MUSCULOSKELETAL:  Strength 5/5 in all extremities.  No clubbing, cyanosis, or edema.  The patient does have reproducible chest wall pain. SKIN:  No rashes.  No subcutaneous crepitation. PSYCH:  Alert, oriented gentleman.  LABORATORY DATA:  CT angio shows no evidence of PE.  It shows some development of pericardial fluid and a small amount of left pleural fluid.  There may also be edema within the anterior mediastinum that can exclude pulmonary edema due to respiratory motion, emphysematous changes.  Troponin 0.0.  White blood count 7.6, hemoglobin 12.3, and platelets 295.  Sodium 142, potassium 3.6, chloride 106, BUN 15, creatinine 1.0.  EKG:  Normal sinus rhythm with no ST-segment changes.  ASSESSMENT AND PLAN: 1. Chest pain, pleuritic in nature. 2. Abnormal CT with pericardial effusion, question edema in the    anterior mediastinum. 3. History of lung cancer with resection. 4. Ongoing tobacco abuse.    The patient will be admitted.  Echo ordered to     evaluate this pericardial effusion.  I will order pain medication.    Resume the patient's home medications.  I will give the patient some    Lasix for  ? pulmonary edema.  Even though there is some concern    because of the unusual location of the pulmonary edema, we will repeat a    chest x-ray in the morning.  Nebulizers will be ordered as well as    sputum cultures, blood cultures.   5. Possible chronic obstructive pulmonary disease exacerbation.  The     patient is wheezing, probably could this be a cardiac wheeze or     from pulmonary issues.  As stated, the patient will be getting     Lasix.  An echo will be ordered to evaluate pericardial effusion as     well as EF.  We will also order nebulizer treatments and start the     patient with some steroids.  Empiric antibiotic ordered. 6. Chronic pain.  Resume the patient's home pain medication.  The     patient will receive DVT prophylaxis.  Cardiac enzymes x3 have been     ordered; however, this is quite doubtful that there is a cardiac     etiology to the patient's chest pain.          ______________________________ Gery Pray, MD     DC/MEDQ  D:  10/21/2010  T:  10/22/2010  Job:  [id number]  Electronically Signed by Gery Pray MD on 10/22/2010 05:43:02 AM

## 2010-10-23 LAB — BASIC METABOLIC PANEL
BUN: 18 mg/dL (ref 6–23)
CO2: 28 mEq/L (ref 19–32)
Glucose, Bld: 149 mg/dL — ABNORMAL HIGH (ref 70–99)
Potassium: 4.8 mEq/L (ref 3.5–5.1)
Sodium: 140 mEq/L (ref 135–145)

## 2010-10-23 LAB — DRUGS OF ABUSE SCREEN W/O ALC, ROUTINE URINE
Amphetamine Screen, Ur: NEGATIVE
Barbiturate Quant, Ur: NEGATIVE
Benzodiazepines.: POSITIVE — AB
Cocaine Metabolites: NEGATIVE
Phencyclidine (PCP): NEGATIVE

## 2010-10-23 LAB — CBC
Hemoglobin: 10.9 g/dL — ABNORMAL LOW (ref 13.0–17.0)
MCH: 31.2 pg (ref 26.0–34.0)
MCV: 95.7 fL (ref 78.0–100.0)
Platelets: 293 10*3/uL (ref 150–400)
RBC: 3.49 MIL/uL — ABNORMAL LOW (ref 4.22–5.81)
WBC: 8.5 10*3/uL (ref 4.0–10.5)

## 2010-10-24 NOTE — Discharge Summary (Signed)
Joseph Torres, Joseph Torres              ACCOUNT NO.:  [id number]  MEDICAL RECORD NO.:  [id number]  LOCATION:  4729                         FACILITY:  [facility]  PHYSICIAN:  Hartley Barefoot, MD    DATE OF BIRTH:  12/09/1955  DATE OF ADMISSION:  10/21/2010 DATE OF DISCHARGE:  10/23/2010                              DISCHARGE SUMMARY   DISCHARGE DIAGNOSES: 1. Chest pain probably secondary to pericarditis, unclear etiology     probably viral. 2. History of lung cancer, status post resection. 3. Chronic obstructive pulmonary disease, possible acute exacerbation.  DISCHARGE MEDICATIONS: 1. Colchicine 0.6 mg p.o. b.i.d. 2. Levofloxacin 500 mg p.o. daily. 3. Prednisone 10 mg 3 tablets by mouth daily. 4. Albuterol inhaled daily as needed. 5. Finasteride 5 mg p.o. daily. 6. Flexeril 10 mg p.o. 4 times daily as needed. 7. Flomax 0.4 mg 1 tablet daily at bedtime. 8. Klonopin 1 mg p.o. b.i.d. as needed. 9. Lunesta 3 mg p.o. daily at bedtime. 10.Lyrica 150 mg daily. 11.Oxymorphone 1 tablet by mouth twice daily as needed. 12.Oxymorphone 5 mg ER 1 tablet 3 times a day. 13.Robaxin 750 mg 4 times a day as needed.  The patient was advised do     not take Robaxin together with his oral pain medication and     Flexeril. 14.Wellbutrin 150 mg p.o. b.i.d. 15.Zoloft 60 mg p.o. daily.  DISPOSITION AND FOLLOWUP:  [name] will need to follow with Dr. Dorris Torres, during that appointment, evaluation for lung cancer recurrence will need to be performed.  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 During that appointment, they will need to consider to give him more refills for prednisone or started prednisone taper.  He might need more refill for colchicine.  We will try to arrange the followup appointment with the primary care physician.  Labs that need to be followed up are ANA, ESR.  RADIOGRAPHIC STUDIES: 1. Chest x-ray showed stable  postoperative changes of the left     hemithorax with persistent mild left lower lobe atelectasis and a     small left pleural effusion.  No new findings. 2. CT angio chest on September 26th show no evidence for PE.     Development of pericardial fluid and a small amount of left pleural     fluid.  There may be also edema within the anterior mediastinum.     Difficult to exclude pulmonary edema due to respiratory motion.     Emphysematous changes. 3. 2-D echo show systolic function was normal, possible hypokinesis of     the basal and mid anteroseptal myocardium.  Pericardium     extracardiac, there was possible mild thickening of the     pericardium.  Small pericardial effusion was identified     circumferential to the heart.  CONSULTANT:  Dr. Kandee Torres.  BRIEF HISTORY OF PRESENT ILLNESS:  This is a 55 year old that presents complaining of chest pain started few days prior to admission.  She related pain was very severe.  It located centrally 10/10.  Was not able to take a deep breath and was having also shortness of breath.  The patient has history of  COPD and he used nebulizer as needed.  For further detail, refer to HPI.  HOSPITAL COURSE: 1. Chest pain likely secondary to pericarditis, probably viral in     etiology.  The patient was started on a steroid for COPD     exacerbation also.  He will be discharged on a prednisone 30 mg     p.o. daily.  He will follow with cardiologist in need to titrate     prednisone dose.  I will discharge him also on colchicine p.o.     b.i.d..  A 2-D echo show a small pericardial effusion.  Mild     hypokinesis.  He might benefit also of a stress test.  He had     elevation of CK-MB, but his troponin were negative.  His CK-MB     increased to 23 and decreased to 11.  On the day of discharge, the     patient was feeling better.  No shortness of breath.  Chest pain     resolved.  I will order ANA and ESR prior to discharge, the patient     wants  to go home. 2. COPD.  The patient presented with shortness of breath.  He was     treated also for COPD exacerbation.  He will be discharged on     prednisone taper and Levaquin for 4 more days.  His shortness of     breath has improved. 3. Pericardial, mediastinum fluid and pleural effusion.  Dr.     Rexanne Torres was consulted and recommended the patient follow up     with Dr. Dorris Torres and to treat the patient for pericarditis.  He     reviewed prior records and CT and appeared that he had a similar     episode in 2010.  The patient will need to follow with Dr.     Dorris Torres.  Dr. Laneta Torres thought that the patient had acute     pericarditis and pleuritis.  The patient will be discharged on     prednisone.  The patient relating intolerance to NSAIDs,  prior     history of bleed.  On the day of discharge, the patient was in a stable condition.  Blood pressure 107/75, sat 93 on room air, pulse 80, respirations 16, temperature 97.8.  Hemoglobin 10, white blood cell 8.5, platelets 293, CK-MB 11, BMET within normal limits.     Hartley Barefoot, MD     BR/MEDQ  D:  10/23/2010  T:  10/23/2010  Job:  [id number]  Electronically Signed by Hartley Barefoot MD on 10/24/2010 08:06:29 PM

## 2010-10-26 LAB — ANA: Anti Nuclear Antibody(ANA): NEGATIVE

## 2010-10-27 LAB — OPIATE, QUANTITATIVE, URINE
Codeine Urine: NEGATIVE NG/ML
Hydrocodone: NEGATIVE NG/ML
Hydromorphone GC/MS Conf: 429 NG/ML — ABNORMAL HIGH
Morphine, Confirm: 61389 NG/ML — ABNORMAL HIGH

## 2010-10-27 LAB — BENZODIAZEPINE, QUANTITATIVE, URINE
Nordiazepam GC/MS Conf: NEGATIVE NG/ML
Oxazepam GC/MS Conf: 532 NG/ML — ABNORMAL HIGH
Temazepam GC/MS Conf: 234 NG/ML — ABNORMAL HIGH

## 2010-10-28 LAB — CULTURE, BLOOD (ROUTINE X 2)
Culture  Setup Time: 201209270403
Culture: NO GROWTH

## 2010-11-09 ENCOUNTER — Encounter: Payer: Self-pay | Admitting: Thoracic Surgery (Cardiothoracic Vascular Surgery)

## 2010-11-09 DIAGNOSIS — J449 Chronic obstructive pulmonary disease, unspecified: Secondary | ICD-10-CM | POA: Insufficient documentation

## 2010-11-09 DIAGNOSIS — C801 Malignant (primary) neoplasm, unspecified: Secondary | ICD-10-CM | POA: Insufficient documentation

## 2010-11-09 LAB — BASIC METABOLIC PANEL
BUN: 10
Calcium: 7.6 — ABNORMAL LOW
Calcium: 7.6 — ABNORMAL LOW
GFR calc Af Amer: >60
GFR calc non Af Amer: >60
GFR calc non Af Amer: >60
Glucose, Bld: 136 — ABNORMAL HIGH
Glucose, Bld: 158 — ABNORMAL HIGH
Potassium: 4.1
Sodium: 139
Sodium: 141

## 2010-11-09 LAB — CBC
HCT: 31.4 — ABNORMAL LOW
Hemoglobin: 12.2 — ABNORMAL LOW
Hemoglobin: 13.9
MCHC: 33.7
Platelets: 103 — ABNORMAL LOW
Platelets: 112 — ABNORMAL LOW
RBC: 4.04 — ABNORMAL LOW
RDW: 17 — ABNORMAL HIGH
RDW: 17.2 — ABNORMAL HIGH
WBC: 12.7 — ABNORMAL HIGH
WBC: 9.3

## 2010-11-09 LAB — COMPREHENSIVE METABOLIC PANEL
ALT: 19
AST: 21
Alkaline Phosphatase: 83
CO2: 25
Calcium: 9.1
Chloride: 101
GFR calc Af Amer: >60
GFR calc non Af Amer: >60
Glucose, Bld: 104 — ABNORMAL HIGH
Potassium: 3.5
Sodium: 134 — ABNORMAL LOW
Total Bilirubin: 0.7

## 2010-11-09 LAB — POCT I-STAT 4, (NA,K, GLUC, HGB,HCT)
Glucose, Bld: 150 — ABNORMAL HIGH
Glucose, Bld: 164 — ABNORMAL HIGH
HCT: 32 — ABNORMAL LOW
Hemoglobin: 9.9 — ABNORMAL LOW
Operator id: 151301
Operator id: 151301
Potassium: 4.1
Potassium: 4.5
Sodium: 136
Sodium: 140

## 2010-11-09 LAB — BLOOD GAS, ARTERIAL
Acid-Base Excess: 0.4
Drawn by: 181601
Drawn by: 274481
FIO2: 0.21
O2 Saturation: 98.5
pCO2 arterial: 32.5 — ABNORMAL LOW
pCO2 arterial: 40.3
pH, Arterial: 7.402
pH, Arterial: 7.475 — ABNORMAL HIGH
pO2, Arterial: 36.5 — CL
pO2, Arterial: 97.2

## 2010-11-09 LAB — TYPE AND SCREEN: ABO/RH(D): O POS

## 2010-11-09 LAB — POCT I-STAT 7, (LYTES, BLD GAS, ICA,H+H)
Acid-base deficit: 5 — ABNORMAL HIGH
Bicarbonate: 21.9
HCT: 29 — ABNORMAL LOW
Operator id: 114421
Sodium: 139

## 2010-11-09 LAB — URINALYSIS, ROUTINE W REFLEX MICROSCOPIC
Glucose, UA: NEGATIVE
Specific Gravity, Urine: 1.038 — ABNORMAL HIGH
pH: 5.5

## 2010-11-09 LAB — PROTIME-INR: Prothrombin Time: 14

## 2010-11-10 ENCOUNTER — Ambulatory Visit
Admission: RE | Admit: 2010-11-10 | Discharge: 2010-11-10 | Disposition: A | Discharge status: Home / Self Care | Payer: Medicare Other | Source: Ambulatory Visit | Attending: Thoracic Surgery (Cardiothoracic Vascular Surgery) | Admitting: Thoracic Surgery (Cardiothoracic Vascular Surgery)

## 2010-11-10 ENCOUNTER — Ambulatory Visit (INDEPENDENT_AMBULATORY_CARE_PROVIDER_SITE_OTHER): Payer: Medicare Other | Admitting: Thoracic Surgery (Cardiothoracic Vascular Surgery)

## 2010-11-10 ENCOUNTER — Encounter: Payer: Self-pay | Admitting: Thoracic Surgery (Cardiothoracic Vascular Surgery)

## 2010-11-10 VITALS — BP 100/74 | HR 90 | Resp 20 | Ht 65.0 in | Wt 158.0 lb

## 2010-11-10 DIAGNOSIS — C3490 Malignant neoplasm of unspecified part of unspecified bronchus or lung: Secondary | ICD-10-CM

## 2010-11-10 DIAGNOSIS — Z902 Acquired absence of lung [part of]: Secondary | ICD-10-CM

## 2010-11-10 DIAGNOSIS — C801 Malignant (primary) neoplasm, unspecified: Secondary | ICD-10-CM

## 2010-11-10 NOTE — Progress Notes (Signed)
PCP is Julieanne Manson, MD Referring Provider is Julieanne Manson, MD  Chief Complaint  Patient presents with  . 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 The surgery was in July of 2008.  He states that since his last visit he was hospitalized at [facility] with a diagnosis of congestive heart failure. He was treated apparently with diuretics, he also was given steroids and antibiotics for possible COPD flare. Since discharge he says his been feeling well, but has been using his albuterol frequently. He continues to smoke 4-5 cigarettes per day.  Past Medical History  Diagnosis Date  . Asthma   . COPD (chronic obstructive pulmonary disease)   . S/P lobectomy of lung left  . Chronic pain   . Chronic anxiety   . Hx: recurrent pneumonia     WITH RESPIRATORY FAILURE, ASSOCIATED WITH SEPSIS IN 2009,   . 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 Pleural effusion, left   . Pericardial effusion   . Adenocarcinoma     STAGE I A, LEFT UPPER LOBE NODULE     Past Surgical History  Procedure Date  . Cervi al spine surgery 1997  . Rotator cuff surgery rt side 1996  . Left elbow surgery     FOR MEDIAN NERVE PALSY  . Left upper lobectomy 08/25/2006    Nicey Krah    No family history on file.  Social History History  Substance Use Topics  . Smoking status: Current Some Day Smoker    Types: Cigarettes  . Smokeless tobacco: Never Used  . Alcohol Use: No    Current Outpatient Prescriptions  Medication Sig Dispense Refill  . albuterol (PROVENTIL HFA;VENTOLIN HFA) 108 (90 BASE) MCG/ACT inhaler Inhale 2 puffs into the lungs every 6 (six) hours as needed.        Marland Kitchen albuterol (PROVENTIL) (2.5 MG/3ML) 0.083% nebulizer solution Take 2.5 mg by nebulization every 6 (six) hours as needed.        Marland Kitchen  buPROPion (WELLBUTRIN XL) 300 MG 24 hr tablet Take 300 mg by mouth daily.        Marland Kitchen oxymorphone (OPANA ER) 40 MG 12 hr tablet Take 40 mg by mouth every 12 (twelve) hours.        . sertraline (ZOLOFT) 50 MG tablet Take 50 mg by mouth daily. 3 TABLETS PO EVERYDAY (150MG )       . Tamsulosin HCl (FLOMAX) 0.4 MG CAPS Take 0.4 mg by mouth daily.        Marland Kitchen zolpidem (AMBIEN) 10 MG tablet Take 10 mg by mouth at bedtime as needed.          Allergies  Allergen Reactions  . Aspirin     REACTION: No history of reaction.  No history of PUD.  Told to stay away from ASA, suspect secondary to other NSAID use.  . Codeine     REACTION: GI intolerance  . Penicillins     REACTION: swells    Review of Systems: Weight stable, recent episode of congestive heart failure, frequent wheezing, chronic productive cough. No new headaches or visual changes, chronic bone and joint pains related to multiple injuries and procedures, unchanged. No hemoptysis.  BP 100/74  Pulse 90  Resp 20  Ht 5\' 5"  (1.651 m)  Wt 158 lb (71.668 kg)  BMI 26.29 kg/m2  SpO2 96% Physical Exam: Joseph Torres is an anxious 55 year old male in no acute distress HEENT exam unremarkable Lungs distant but clear breath sounds no wheezing Cardiac regular rate and rhythm normal S1 and S2 No cervical, supraclavicular, axillary or epitrochlear adenopathy   Diagnostic Tests: CT of chest and abdomen shows no evidence of recurrent disease. There are small bilateral adrenal masses which have been present and are consistent with adenomas. There are small renal calculi bilaterally. There are small lung nodules which are unchanged.   Impression:  Joseph Torres is a 55 year old gentleman is now over 4 years out from a left upper lobectomy stage IA adenocarcinoma he has no evidence of current disease. He does continue to smoke and I again today counseled him on smoking cessation as done every previous visit, he does not indicate any intention to quit  smoking.  Plan Prescription for albuterol inhalers 2 puffs 4 times daily as needed I will plan to see him back in 9 months, July 2013, for his five-year followup visit. We will do a CT of the chest at that time.

## 2010-11-12 LAB — BASIC METABOLIC PANEL
BUN: 13
Calcium: 9.2
Creatinine, Ser: 0.8
GFR calc non Af Amer: >60
Glucose, Bld: 101 — ABNORMAL HIGH
Sodium: 137

## 2010-11-12 LAB — CBC
Platelets: 194
RDW: 14.1 — ABNORMAL HIGH

## 2010-11-18 NOTE — Consult Note (Signed)
Joseph Torres, Joseph Torres              ACCOUNT NO.:  [id number]  MEDICAL RECORD NO.:  [id number]  LOCATION:                                 FACILITY:  PHYSICIAN:  Evelene Croon, M.D.     DATE OF BIRTH:  1955/09/14  DATE OF CONSULTATION:  10/22/2010 DATE OF DISCHARGE:                                CONSULTATION   REFERRING PHYSICIAN:  Gery Pray, MD  REASON FOR CONSULTATION:  Left pleural effusion and pericardial effusion with history of lung cancer resection.  CLINICAL HISTORY:  I was asked by Dr. Joneen Roach to evaluate Joseph Torres for the above problem.  He is a 55 year old smoker who underwent a left upper lobectomy for a stage I adenocarcinoma of the lung on September 30, 2006, by Dr. Charlett Lango.  He has been followed according to our regular surveillance schedule without evidence of recurrence.  He has continued to smoke.  He said he was in his usual state of health until about 2 days prior to admission when he began developing sharp severe substernal chest discomfort radiating into the right side of his chest as well as around the left side of the chest.  This was made worse by taking deep breath as well as by lying flat or lying on his right side. He denied any fever or chills.  He did have some cough with some whitish sputum.  His underwent a CT angiogram of the chest on admission which showed a small left pleural effusion.  There was no significant pleural abnormality other than a small effusion.  There were no new lung lesions.  There was no mediastinal or hilar adenopathy.  There was mild thickening of the anterior mediastinal fatty tissues.  This was nonspecific.  He also had a small pericardial effusion.  He underwent a 2-D echocardiogram today which confirmed a tiny circumferential pericardial effusion.  The patient said he had a similar episode in December 2010.  He said that this was worked up with a CT scan of the chest and stress echocardiogram.  I reviewed  the studies from December 2010 and the CT scan of the chest at that time looked exactly like the CT scan today with a small left pleural effusion as well as some thickening of the anterior mediastinal fat and no new lung lesions or adenopathy.  He also had a small circumferential pericardial effusion at that time that was confirmed by a stress echocardiogram.  He said that he was not admitted at that time and does not remember having any treatment or being told why he had the pain.  It resolved pretty quickly.  MEDICATIONS PRIOR TO ADMISSION: 1. Flomax 0.4 mg daily. 2. Wellbutrin 150 mg daily. 3. Opana 10 mg p.r.n. for pain. 4. Valium 5 mg b.i.d. 5. Ambien at bedtime p.r.n. 6. Nebulizer p.r.n.  REVIEW OF SYSTEMS:  GENERAL:  He denies any fever or chills.  He said his appetite is chronically poor, but his weight has been stable.  He denies fatigue.  HEENT:  Eyes:  He has had no visual changes.  ENT: Negative.  ENDOCRINE:  He has no history of diabetes or hypothyroidism. CARDIOVASCULAR:  As above.  He  had new-onset substernal chest discomfortradiating into the left and right sides of his chest, made worse by deep breathing and lying on his back on his right side.  He denies exertional dyspnea.  He has had no PND or orthopnea.  He denies any peripheral edema and has had no palpitations.  RESPIRATORY:  He has cough, productive of whitish sputum.  He denies hemoptysis.  GASTROINTESTINAL: He has had no nausea or vomiting.  He denies melena and bright red blood per rectum.  GENITOURINARY:  He denies dysuria and hematuria. MUSCULOSKELETAL:  He has a chronically stiff neck after cervical spine surgery.  ALLERGIES:  PENICILLIN, CODEINE, ASPIRIN.  PAST MEDICAL HISTORY:  Significant for: 1. Stage I adenocarcinoma of the lung status post left upper lobectomy     in 2008. 2. History of cervical spine surgery in 1997. 3. History of rotator cuff surgery in 1996 on the right side. 4. 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 5. History of COPD. 6. History of chronic pain and anxiety. 7. History of pneumonia with respiratory failure, associated with     sepsis in 2009.  He had ARDS and was on the ventilator with     multisystem organ failure.  SOCIAL HISTORY:  He is married and lives with his wife.  He smokes anywhere from 3-7 cigarettes per day and blames that on his wife who continues to smoke.  He does use a walker to get around.  He is on disability for chronic pain.  FAMILY HISTORY:  Noncontributory.  PHYSICAL EXAMINATION:  VITAL SIGNS:  He is afebrile.  Blood pressure is 117/70, pulse is 96 and regular, respiratory rate is 16 and unlabored, oxygen saturation on room air is 94%. GENERAL:  He is a well-developed white male, in no distress. HEENT:  Normocephalic and atraumatic.  Pupils are equal and reactive to light and accommodation.  Extraocular muscles are intact.  Oropharynx is clear. NECK:  Normal carotid pulses bilaterally.  There are no bruits.  There is no adenopathy or thyromegaly.  There is no supraclavicular or axillary adenopathy. CARDIAC:  Regular rate and rhythm with normal S1 and S2.  There is no murmur, rub, or gallop. LUNGS:  Distant breath sounds throughout.  The left thoracotomy scar looks fine.  There are no skin lesions. ABDOMEN:  Active bowel sounds.  His abdomen is soft and nontender. There are no palpable masses or organomegaly. EXTREMITIES:  No peripheral edema.  Motor and sensory exams grossly normal.  LABORATORY EXAMINATION:  White blood cell count on admission of 11.6, hemoglobin 12.3, platelet count of 295,000.  His troponin I was negative.  His initial CPK was 518 with an MB of 23.8.  The second CPK was 475 with MB of 15.6.  Electrolytes were normal with BUN of 14, creatinine of 0.85.  IMPRESSION:  Joseph Torres has a small left pleural effusion and a tiny circumferential pericardial effusion with symptoms that sound  pleuritic and due to inflammation.  He had the exact same symptoms in December 2010, and his CT scan and echocardiogram looked exactly the same at that time.  He did have another CT scan in August 2011 which showed that the pericardial and pleural effusion as well as thickening in his anterior mediastinal fat had completely resolved.  Given his history, I suspect that this is probably acute pericarditis and pleuritis, possibly related to a viral illness.  I doubt recurrence of cancer given the fact that he is 4 years out from resection  of a stage I cancer and has no other evidence of recurrence on his CT scan.  I would recommend treatment of this as if it is an acute inflammatory condition with anti-inflammatory medication to see if that gets better.  He does have a followup appointment already with Dr. Dorris Fetch in a couple of weeks and Dr. Dorris Fetch can decide if further workup is needed at that time.  If these effusions worsen over that period of time, then he may require drainage.  I do not think there is any need for drainage of these effusions at this time.  I discussed all this with the patient and he is in agreement.     Evelene Croon, M.D.     BB/MEDQ  D:  10/22/2010  T:  10/23/2010  Job:  [id number]  Electronically Signed by Evelene Croon M.D. on 11/18/2010 02:41:16 PM

## 2011-07-12 ENCOUNTER — Emergency Department (HOSPITAL_COMMUNITY): Payer: Medicare Other

## 2011-07-12 ENCOUNTER — Inpatient Hospital Stay (HOSPITAL_COMMUNITY): Payer: Medicare Other

## 2011-07-12 ENCOUNTER — Inpatient Hospital Stay (HOSPITAL_COMMUNITY)
Admission: EM | Admit: 2011-07-12 | Discharge: 2011-07-13 | DRG: 558 | Disposition: A | Discharge status: Home / Self Care | Payer: Medicare Other | Attending: Internal Medicine | Admitting: Internal Medicine

## 2011-07-12 ENCOUNTER — Encounter (HOSPITAL_COMMUNITY): Payer: Self-pay | Admitting: *Deleted

## 2011-07-12 DIAGNOSIS — R634 Abnormal weight loss: Secondary | ICD-10-CM

## 2011-07-12 DIAGNOSIS — E278 Other specified disorders of adrenal gland: Secondary | ICD-10-CM

## 2011-07-12 DIAGNOSIS — N401 Enlarged prostate with lower urinary tract symptoms: Secondary | ICD-10-CM

## 2011-07-12 DIAGNOSIS — Z880 Allergy status to penicillin: Secondary | ICD-10-CM

## 2011-07-12 DIAGNOSIS — D649 Anemia, unspecified: Secondary | ICD-10-CM

## 2011-07-12 DIAGNOSIS — G4700 Insomnia, unspecified: Secondary | ICD-10-CM

## 2011-07-12 DIAGNOSIS — G5620 Lesion of ulnar nerve, unspecified upper limb: Secondary | ICD-10-CM

## 2011-07-12 DIAGNOSIS — F17200 Nicotine dependence, unspecified, uncomplicated: Secondary | ICD-10-CM

## 2011-07-12 DIAGNOSIS — G8929 Other chronic pain: Secondary | ICD-10-CM

## 2011-07-12 DIAGNOSIS — C801 Malignant (primary) neoplasm, unspecified: Secondary | ICD-10-CM

## 2011-07-12 DIAGNOSIS — J449 Chronic obstructive pulmonary disease, unspecified: Secondary | ICD-10-CM

## 2011-07-12 DIAGNOSIS — M545 Low back pain, unspecified: Secondary | ICD-10-CM

## 2011-07-12 DIAGNOSIS — J029 Acute pharyngitis, unspecified: Secondary | ICD-10-CM

## 2011-07-12 DIAGNOSIS — J441 Chronic obstructive pulmonary disease with (acute) exacerbation: Secondary | ICD-10-CM

## 2011-07-12 DIAGNOSIS — E7800 Pure hypercholesterolemia, unspecified: Secondary | ICD-10-CM

## 2011-07-12 DIAGNOSIS — G5600 Carpal tunnel syndrome, unspecified upper limb: Secondary | ICD-10-CM

## 2011-07-12 DIAGNOSIS — N138 Other obstructive and reflux uropathy: Secondary | ICD-10-CM

## 2011-07-12 DIAGNOSIS — M79609 Pain in unspecified limb: Secondary | ICD-10-CM | POA: Diagnosis present

## 2011-07-12 DIAGNOSIS — K5900 Constipation, unspecified: Secondary | ICD-10-CM

## 2011-07-12 DIAGNOSIS — I319 Disease of pericardium, unspecified: Secondary | ICD-10-CM

## 2011-07-12 DIAGNOSIS — J069 Acute upper respiratory infection, unspecified: Secondary | ICD-10-CM

## 2011-07-12 DIAGNOSIS — R404 Transient alteration of awareness: Secondary | ICD-10-CM

## 2011-07-12 DIAGNOSIS — M6282 Rhabdomyolysis: Secondary | ICD-10-CM

## 2011-07-12 DIAGNOSIS — R4182 Altered mental status, unspecified: Secondary | ICD-10-CM

## 2011-07-12 DIAGNOSIS — Z79899 Other long term (current) drug therapy: Secondary | ICD-10-CM

## 2011-07-12 DIAGNOSIS — R1011 Right upper quadrant pain: Secondary | ICD-10-CM

## 2011-07-12 DIAGNOSIS — J90 Pleural effusion, not elsewhere classified: Secondary | ICD-10-CM

## 2011-07-12 DIAGNOSIS — E870 Hyperosmolality and hypernatremia: Secondary | ICD-10-CM

## 2011-07-12 DIAGNOSIS — M5030 Other cervical disc degeneration, unspecified cervical region: Secondary | ICD-10-CM

## 2011-07-12 DIAGNOSIS — R1013 Epigastric pain: Secondary | ICD-10-CM

## 2011-07-12 DIAGNOSIS — Z85118 Personal history of other malignant neoplasm of bronchus and lung: Secondary | ICD-10-CM

## 2011-07-12 DIAGNOSIS — F3289 Other specified depressive episodes: Secondary | ICD-10-CM

## 2011-07-12 DIAGNOSIS — D539 Nutritional anemia, unspecified: Secondary | ICD-10-CM

## 2011-07-12 DIAGNOSIS — Y92009 Unspecified place in unspecified non-institutional (private) residence as the place of occurrence of the external cause: Secondary | ICD-10-CM

## 2011-07-12 DIAGNOSIS — M5412 Radiculopathy, cervical region: Secondary | ICD-10-CM

## 2011-07-12 DIAGNOSIS — R197 Diarrhea, unspecified: Secondary | ICD-10-CM

## 2011-07-12 DIAGNOSIS — R42 Dizziness and giddiness: Secondary | ICD-10-CM

## 2011-07-12 DIAGNOSIS — J189 Pneumonia, unspecified organism: Secondary | ICD-10-CM

## 2011-07-12 DIAGNOSIS — F329 Major depressive disorder, single episode, unspecified: Secondary | ICD-10-CM

## 2011-07-12 DIAGNOSIS — J4489 Other specified chronic obstructive pulmonary disease: Secondary | ICD-10-CM

## 2011-07-12 DIAGNOSIS — Z886 Allergy status to analgesic agent status: Secondary | ICD-10-CM

## 2011-07-12 DIAGNOSIS — J209 Acute bronchitis, unspecified: Secondary | ICD-10-CM

## 2011-07-12 DIAGNOSIS — R945 Abnormal results of liver function studies: Secondary | ICD-10-CM

## 2011-07-12 DIAGNOSIS — M79606 Pain in leg, unspecified: Secondary | ICD-10-CM

## 2011-07-12 DIAGNOSIS — Z87898 Personal history of other specified conditions: Secondary | ICD-10-CM

## 2011-07-12 DIAGNOSIS — R071 Chest pain on breathing: Secondary | ICD-10-CM

## 2011-07-12 DIAGNOSIS — R400 Somnolence: Secondary | ICD-10-CM

## 2011-07-12 DIAGNOSIS — T398X5A Adverse effect of other nonopioid analgesics and antipyretics, not elsewhere classified, initial encounter: Secondary | ICD-10-CM | POA: Diagnosis present

## 2011-07-12 DIAGNOSIS — M7989 Other specified soft tissue disorders: Secondary | ICD-10-CM

## 2011-07-12 DIAGNOSIS — R7989 Other specified abnormal findings of blood chemistry: Secondary | ICD-10-CM

## 2011-07-12 HISTORY — DX: Malignant neoplasm of unspecified part of unspecified bronchus or lung: C34.90

## 2011-07-12 LAB — DIFFERENTIAL
Basophils Absolute: 0 10*3/uL (ref 0.0–0.1)
Basophils Relative: 1 % (ref 0–1)
Eosinophils Absolute: 0.2 10*3/uL (ref 0.0–0.7)
Lymphocytes Relative: 35 % (ref 12–46)
Monocytes Absolute: 0.9 10*3/uL (ref 0.1–1.0)
Monocytes Absolute: 0.8 10*3/uL (ref 0.1–1.0)
Monocytes Relative: 12 % (ref 3–12)
Monocytes Relative: 12 % (ref 3–12)
Neutro Abs: 3.5 10*3/uL (ref 1.7–7.7)

## 2011-07-12 LAB — ETHANOL: Alcohol, Ethyl (B): <11 mg/dL (ref 0–11)

## 2011-07-12 LAB — CBC
HCT: 32.1 % — ABNORMAL LOW (ref 39.0–52.0)
HCT: 32.5 % — ABNORMAL LOW (ref 39.0–52.0)
Hemoglobin: 10.9 g/dL — ABNORMAL LOW (ref 13.0–17.0)
Hemoglobin: 11 g/dL — ABNORMAL LOW (ref 13.0–17.0)
MCH: 32.5 pg (ref 26.0–34.0)
MCHC: 33.8 g/dL (ref 30.0–36.0)
WBC: 7.2 10*3/uL (ref 4.0–10.5)

## 2011-07-12 LAB — RAPID URINE DRUG SCREEN, HOSP PERFORMED
Amphetamines: NOT DETECTED
Benzodiazepines: POSITIVE — AB
Opiates: NOT DETECTED

## 2011-07-12 LAB — URINALYSIS, ROUTINE W REFLEX MICROSCOPIC
Glucose, UA: NEGATIVE mg/dL
Hgb urine dipstick: NEGATIVE
Protein, ur: NEGATIVE mg/dL
pH: 5.5 (ref 5.0–8.0)

## 2011-07-12 LAB — PROTIME-INR
INR: 1.2 (ref 0.00–1.49)
Prothrombin Time: 15.5 seconds — ABNORMAL HIGH (ref 11.6–15.2)

## 2011-07-12 LAB — COMPREHENSIVE METABOLIC PANEL
AST: 88 U/L — ABNORMAL HIGH (ref 0–37)
Alkaline Phosphatase: 78 U/L (ref 39–117)
BUN: 16 mg/dL (ref 6–23)
CO2: 27 mEq/L (ref 19–32)
Chloride: 103 mEq/L (ref 96–112)
Creatinine, Ser: 1.17 mg/dL (ref 0.50–1.35)
GFR calc non Af Amer: 68 mL/min — ABNORMAL LOW (ref >90)
Total Bilirubin: 0.4 mg/dL (ref 0.3–1.2)

## 2011-07-12 LAB — URINE MICROSCOPIC-ADD ON

## 2011-07-12 LAB — CK: Total CK: 2247 U/L — ABNORMAL HIGH (ref 7–232)

## 2011-07-12 LAB — CARDIAC PANEL(CRET KIN+CKTOT+MB+TROPI)
Relative Index: 0.7 (ref 0.0–2.5)
Total CK: 1867 U/L — ABNORMAL HIGH (ref 7–232)

## 2011-07-12 LAB — APTT: aPTT: 38 seconds — ABNORMAL HIGH (ref 24–37)

## 2011-07-12 LAB — GLUCOSE, CAPILLARY

## 2011-07-12 LAB — POCT I-STAT TROPONIN I: Troponin i, poc: 0 ng/mL (ref 0.00–0.08)

## 2011-07-12 MED ORDER — ALBUTEROL SULFATE HFA 108 (90 BASE) MCG/ACT IN AERS: 2.0000 | INHALATION_SPRAY | Freq: Four times a day (QID) | RESPIRATORY_TRACT | Status: DC | PRN

## 2011-07-12 MED ORDER — ACETAMINOPHEN 650 MG RE SUPP: 650.0000 mg | Freq: Four times a day (QID) | RECTAL | Status: DC | PRN

## 2011-07-12 MED ORDER — GADOBENATE DIMEGLUMINE 529 MG/ML IV SOLN
15.0000 mL | Freq: Once | INTRAVENOUS | Status: AC | PRN
  Administered 2011-07-12: 15 mL via INTRAVENOUS

## 2011-07-12 MED ORDER — OXYMORPHONE HCL ER 40 MG PO TB12: 40.0000 mg | ORAL_TABLET | Freq: Three times a day (TID) | ORAL | Status: DC

## 2011-07-12 MED ORDER — SERTRALINE HCL 50 MG PO TABS
50.0000 mg | ORAL_TABLET | Freq: Every day | ORAL | Status: DC
  Administered 2011-07-12 – 2011-07-13 (×2): 50 mg via ORAL
  Filled 2011-07-12 (×2): qty 1

## 2011-07-12 MED ORDER — TAMSULOSIN HCL 0.4 MG PO CAPS
0.4000 mg | ORAL_CAPSULE | Freq: Every day | ORAL | Status: DC
  Administered 2011-07-12 – 2011-07-13 (×2): 0.4 mg via ORAL
  Filled 2011-07-12 (×2): qty 1

## 2011-07-12 MED ORDER — SODIUM CHLORIDE 0.9 % IJ SOLN: 3.0000 mL | Freq: Two times a day (BID) | INTRAMUSCULAR | Status: DC

## 2011-07-12 MED ORDER — ACETAMINOPHEN 325 MG PO TABS: 650.0000 mg | ORAL_TABLET | Freq: Four times a day (QID) | ORAL | Status: DC | PRN

## 2011-07-12 MED ORDER — SODIUM CHLORIDE 0.9 % IV SOLN: INTRAVENOUS | Status: DC

## 2011-07-12 MED ORDER — BUPROPION HCL ER (XL) 300 MG PO TB24
300.0000 mg | ORAL_TABLET | Freq: Every day | ORAL | Status: DC
  Administered 2011-07-13: 300 mg via ORAL
  Filled 2011-07-12: qty 1

## 2011-07-12 MED ORDER — ONDANSETRON HCL 4 MG/2ML IJ SOLN: 4.0000 mg | Freq: Four times a day (QID) | INTRAMUSCULAR | Status: DC | PRN

## 2011-07-12 MED ORDER — MORPHINE SULFATE ER 100 MG PO TBCR
100.0000 mg | EXTENDED_RELEASE_TABLET | Freq: Three times a day (TID) | ORAL | Status: DC
  Administered 2011-07-12 – 2011-07-13 (×2): 100 mg via ORAL
  Filled 2011-07-12 (×2): qty 1

## 2011-07-12 MED ORDER — SODIUM CHLORIDE 0.9 % IV SOLN
INTRAVENOUS | Status: DC
  Administered 2011-07-12: 23:00:00 via INTRAVENOUS

## 2011-07-12 MED ORDER — ALUM & MAG HYDROXIDE-SIMETH 200-200-20 MG/5ML PO SUSP: 30.0000 mL | Freq: Four times a day (QID) | ORAL | Status: DC | PRN

## 2011-07-12 MED ORDER — ONDANSETRON HCL 4 MG PO TABS: 4.0000 mg | ORAL_TABLET | Freq: Four times a day (QID) | ORAL | Status: DC | PRN

## 2011-07-12 MED ORDER — SODIUM CHLORIDE 0.9 % IV SOLN
INTRAVENOUS | Status: DC
  Administered 2011-07-12: 125 mL/h via INTRAVENOUS

## 2011-07-12 NOTE — ED Notes (Signed)
Pt was in [place] hospital and checked himself out and they told them that his kidney functions were rising.  They found a problem on his inner left side and has swelling down entire left side.  Pulse is present.    Pt speech is slurred since waking up this am.  Pt is sleepy.  Pt takes pain medications for back issues

## 2011-07-12 NOTE — ED Notes (Signed)
In and out cath performed without difficulty; bladder drained and urine sample collected (sent to lab).  Pt. returned to resting/trying to sleep

## 2011-07-12 NOTE — ED Notes (Signed)
Was in [facility] hospital last week, left AMA. Wife reports pt continues to be confused, & now worsening lethargy today. Pt arouses to name easily, oriented to person, place but not time. Speech slurred, slow to respond. C/o back & neck pain pain which is chronic. C/o left upper thigh pain x 2 weeks, 2+ edema LLE. Denies dysuria, hematuria, n/v/d. Wife reports eating & drinking normally but has been "doing nothing but sleep"

## 2011-07-12 NOTE — ED Notes (Signed)
Admitting MD at bedside.

## 2011-07-12 NOTE — ED Provider Notes (Signed)
History     CSN: [id number]  Arrival date & time 07/12/11  1233   First MD Initiated Contact with Patient 07/12/11 1410      Chief Complaint  Patient presents with  . Altered Mental Status    (Consider location/radiation/quality/duration/timing/severity/associated sxs/prior treatment) HPI Comments: Patient is a 56 year old man who was brought to [facility] by his wife for evaluation of altered mental status. She says that he had been hospitalized at the hospital in [place] last week, and had left AMA. His illness had something to do with his kidneys. There was something on his right side it was affecting his kidneys, that seem to be getting worse. He had signed out AMA from the hospital in [place]. His situation is worsened, and he seems unusually drowsy. He does have chronic pain and is on chronic narcotics. Also, the wife says his left leg is swollen.  Patient is a 56 y.o. male presenting with altered mental status. The history is provided by the spouse. No language interpreter was used.  Altered Mental Status This is a new problem. The current episode started more than 1 week ago. The problem occurs constantly. The problem has been gradually worsening. Nothing aggravates the symptoms. Nothing relieves the symptoms. He has tried nothing for the symptoms.    Past Medical History  Diagnosis Date  . Asthma   . COPD (chronic obstructive pulmonary disease)   . S/P lobectomy of lung left  . Chronic pain   . Chronic anxiety   . Hx: recurrent pneumonia     WITH RESPIRATORY FAILURE, ASSOCIATED WITH SEPSIS IN 2009,   . 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 Pleural effusion, left   . Pericardial effusion   . Adenocarcinoma     STAGE I A, LEFT UPPER LOBE NODULE     Past Surgical History  Procedure Date  . Cervi al spine surgery 1997  . Rotator cuff surgery rt side 1996  . Left elbow surgery     FOR MEDIAN NERVE PALSY  . Left upper  lobectomy 08/25/2006    [facility]    No family history on file.  History  Substance Use Topics  . Smoking status: Current Some Day Smoker    Types: Cigarettes  . Smokeless tobacco: Never Used  . Alcohol Use: No      Review of Systems  Unable to perform ROS: Mental status change  Psychiatric/Behavioral: Positive for altered mental status.    Allergies  Aspirin; Codeine; and Penicillins  Home Medications   Current Outpatient Rx  Name Route Sig Dispense Refill  . CLONAZEPAM 2 MG PO TABS Oral Take 2 mg by mouth 2 (two) times daily.    Marland Kitchen OXYMORPHONE HCL ER 40 MG PO TB12 Oral Take 40 mg by mouth every 8 (eight) hours.    . OXYMORPHONE HCL 10 MG PO TABS Oral Take 10 mg by mouth 3 (three) times daily.    Marland Kitchen PREGABALIN 200 MG PO CAPS Oral Take 200 mg by mouth 4 (four) times daily.    Marland Kitchen TAMSULOSIN HCL 0.4 MG PO CAPS Oral Take 0.4 mg by mouth daily.      Marland Kitchen ZOLPIDEM TARTRATE 10 MG PO TABS Oral Take 10 mg by mouth at bedtime as needed. For sleep.    . ALBUTEROL SULFATE HFA 108 (90 BASE) MCG/ACT IN AERS Inhalation Inhale 2 puffs into the lungs every 6 (six) hours as needed. FOR SHORTNESS OF BREATH    .  ALBUTEROL SULFATE (2.5 MG/3ML) 0.083% IN NEBU Nebulization Take 2.5 mg by nebulization every 6 (six) hours as needed. FOR SHORTNESS OF BREATH    . BUPROPION HCL ER (XL) 300 MG PO TB24 Oral Take 300 mg by mouth daily.        BP 92/62  Pulse 66  Temp 98.1 F (36.7 C) (Oral)  Resp 20  SpO2 95%  Physical Exam  Nursing note and vitals reviewed. Constitutional: He appears well-developed and well-nourished. No distress.       He is somnolent, but arouses to verbal stimuli.  HENT:  Head: Normocephalic and atraumatic.  Right Ear: External ear normal.  Left Ear: External ear normal.       Edentulous.  Eyes: Conjunctivae and EOM are normal. Pupils are equal, round, and reactive to light. Left eye exhibits no discharge. No scleral icterus.  Neck: Normal range of motion. Neck supple.    Cardiovascular: Normal rate, regular rhythm and normal heart sounds.   Pulmonary/Chest: Effort normal and breath sounds normal.  Abdominal: Soft. Bowel sounds are normal.  Musculoskeletal:       Left ankle and left calf are swollen, but nontender.   Neurological:       Somnolent.  No apparent sensory or motor deficit.  Skin: Skin is warm and dry.  Psychiatric:       Very drowsy.      ED Course  Procedures (including critical care time)  Labs Reviewed  CBC - Abnormal; Notable for the following:    RBC 3.35 (*)     Hemoglobin 10.9 (*)     HCT 32.1 (*)     RDW 15.9 (*)     All other components within normal limits  DIFFERENTIAL  GLUCOSE, CAPILLARY  URINALYSIS, ROUTINE W REFLEX MICROSCOPIC  COMPREHENSIVE METABOLIC PANEL   Dg Chest 2 View  07/12/2011  *RADIOLOGY REPORT*  Clinical Data: Adrenal mass  CHEST - 2 VIEW  Comparison: 10/22/2010  Findings: Stable postoperative changes and volume loss in the left hemithorax.  Lungs are clear and hyperaerated.  No pneumothorax. No pleural effusion.  IMPRESSION: Postoperative changes.  No active cardiopulmonary disease.  Original Report Authenticated By: Donavan Burnet, M.D.   2:40 PM Pt was seen and had physical examination.  Lab workup ordered.  Old charts from [facility] requested.   3:50 PM CT of head was negative. Chest x-ray shows postop changes from prior chest surgery.  CBC shows Anemia with Hct 32. And Hb 10.9.  WBC is 7200.  CMET shows lipase slightly elevated at 88, ALT mildly elevated at 144.  CK total is high at 2247, suggesting Rhabdomyolysis. ETOH <11, essentially negative.  Salicylates and acetaminophen negative. Ammonia is 35, not elevated. Venous doppler of left leg was negative.Waiting for results of urine testing.  CKMB and TNI ordered.    4:04 PM Review of records from [facility] in [place] shows that he was hospitalized on 07/06/11 for left leg pain, was found to have rhabdomyolysis and a large area in the left  thigh of muscle necrosis.  CK was >20,000 there.  He left AMA on 07/07/11.   5:00 PM CKMB high at 13.0, called to me from the lab. TNI WNL.  UA negative.  UDS still pending.   6:02 PM UDS positive for benzodiazepines and for marijuana.  Pt is more awake now.  I advised him that he should have continued hydration to treat him for rhabdomyolysis.  I brought out to him that he should take treatment,  and not leave AMA. He said that at the other hospital his room was full of police at 2 A.M.  I advised him I did not have any reason to call police on him.  Call to [facility] medicine --> 6:19 PM Admit to a med-surg bed to [facility], Dr. Irene Limbo.  1. Altered mental status   2. Rhabdomyolysis   3. Somnolence   4. Leg pain   5. Elevated LFTs   6. Anemia   7. Chronic pain   8. Hypernatremia   9. ADRENAL MASS, BILATERAL   10. HYPERCHOLESTEROLEMIA, MILD   11. MACROCYTIC ANEMIA   12. TOBACCO ABUSE   13. DEPRESSION   14. CARPAL TUNNEL SYNDROME, LEFT   15. ULNAR NEUROPATHY, LEFT   16. PERICARDIAL EFFUSION   17. SORE THROAT   18. UPPER RESPIRATORY INFECTION   19. BRONCHITIS, ACUTE WITH BRONCHOSPASM   20. PNEUMONIA, LEFT   21. CHRONIC OBSTRUCTIVE PULMONARY DISEASE, ACUTE EXACERBATION   22. COPD, MILD   23. PLEURAL EFFUSION, LEFT   24. CONSTIPATION   25. BENIGN PROSTATIC HYPERTROPHY, WITH OBSTRUCTION   26. DEGENERATIVE DISC DISEASE, CERVICAL SPINE   27. CERVICAL RADICULOPATHY, LEFT   28. BACK PAIN, LUMBAR, CHRONIC   29. VERTIGO   30. INSOMNIA   31. WEIGHT LOSS   32. Painful respiration   33. SYMPTOM, PAIN, ABDOMINAL, RIGHT UP QUADRANT   34. Abdominal pain, epigastric   35. LIVER FUNCTION TESTS, ABNORMAL   36. BENIGN PROSTATIC HYPERTROPHY, MILD, HX OF   37. DIARRHEA, ACUTE   38. COPD (chronic obstructive pulmonary disease)   39. Adenocarcinoma            Carleene Cooper III, MD 07/13/11 1910

## 2011-07-12 NOTE — ED Notes (Signed)
Patient transported to X-ray 

## 2011-07-12 NOTE — H&P (Signed)
History and Physical  Joseph Torres ZOX:[id number] DOB: [birth year] DOA: 07/12/2011  Referring physician: Carleene Cooper, MD PCP: Unknown (wife cannot recall) [facility] clinic: [facility]  Chief Complaint: leg pain, somnolence  HPI:  56 year old man with history of chronic pain managed with narcotics and Klonopin presented to the emergency department for evaluation of somnolence and left leg pain present since 07/04/11. Patient self administers his own medication and his wife does not believe that he has been taking more than he should. She noted increased somnolence today and became concerned. She also notices left leg is become increasingly swollen. At baseline the patient has mild memory loss and frequent falls. He felt least twice a day.  Wife was called at work to notify her that her husband fell at home on 07/04/11. He went to [facility] where he was admitted for fall, left-sided weakness and slurred speech. A code stroke was called. The patient was evaluated by neurology. By report CT, CT of the head as well as MRI were negative. He was noted have an elevated CK of 18,000 and a left-sided foot drop as well as mild acute renal failure. When the patient was not allowed to smoke he left AGAINST MEDICAL ADVICE. The patient then presented to [facility]  6/11 for complaint of left lower extremity pain and back pain. Evaluation at that time revealed a CK greater than 20,000. He was admitted for treatment for rhabdomyolysis. MRI of the left thigh revealed large geographic areas of presumed muscle infarct or rhabdomyolysis. Internal plain films of the left femur were negative. Lower extremity venous Doppler was negative for DVT. There seems to be some issue is pain medication and during that hospitalization the patient became upset when he is not allowed to walk outside and left AGAINST MEDICAL ADVICE 6/12.  History supplemented by records from  [facility]. No records from [facility].  Chart Review:  As noted above.  Review of Systems:  Negative for fever, changes to his vision, sore throat, rash, chest pain, shortness of breath, dysuria, bleeding, nausea, vomiting. Negative for paresthesias and paralysis of the left leg.  Positive for normal appetite and intake.  Past Medical History  Diagnosis Date  . Asthma   . COPD (chronic obstructive pulmonary disease)   . S/P lobectomy of lung left  . Chronic pain   . Chronic anxiety   . Hx: recurrent pneumonia     WITH RESPIRATORY FAILURE, ASSOCIATED WITH SEPSIS IN 2009,   . 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 Pleural effusion, left   . Pericardial effusion   . Lung cancer     STAGE I A, LEFT UPPER LOBE NODULE    Past Surgical History  Procedure Date  . Cervi al spine surgery 1997  . Rotator cuff surgery rt side 1996  . Left elbow surgery     FOR MEDIAN NERVE PALSY  . Left upper lobectomy 08/25/2006    [facility]   Social History:  reports that he has been smoking Cigarettes.  He has never used smokeless tobacco. He reports that he does not drink alcohol or use illicit drugs.  Allergies  Allergen Reactions  . Aspirin     REACTION: No history of reaction.  No history of PUD.  Told to stay away from ASA, suspect secondary to other NSAID use.  . Codeine     REACTION: GI intolerance  . Penicillins  REACTION: swells   No family history on file. no significant history per wife.  Prior to Admission medications   Medication Sig Start Date End Date Taking? Authorizing Provider  clonazePAM (KLONOPIN) 2 MG tablet Take 2 mg by mouth 2 (two) times daily.   Yes Historical Provider, MD  oxymorphone (OPANA ER) 40 MG 12 hr tablet Take 40 mg by mouth every 8 (eight) hours.   Yes Historical Provider, MD  oxymorphone (OPANA) 10 MG tablet Take 10 mg by mouth 3 (three) times daily.   Yes Historical Provider, MD    pregabalin (LYRICA) 200 MG capsule Take 200 mg by mouth 4 (four) times daily.   Yes Historical Provider, MD  Tamsulosin HCl (FLOMAX) 0.4 MG CAPS Take 0.4 mg by mouth daily.     Yes Historical Provider, MD  zolpidem (AMBIEN) 10 MG tablet Take 10 mg by mouth at bedtime as needed. For sleep.   Yes Historical Provider, MD  albuterol (PROVENTIL HFA;VENTOLIN HFA) 108 (90 BASE) MCG/ACT inhaler Inhale 2 puffs into the lungs every 6 (six) hours as needed. FOR SHORTNESS OF BREATH    Historical Provider, MD  albuterol (PROVENTIL) (2.5 MG/3ML) 0.083% nebulizer solution Take 2.5 mg by nebulization every 6 (six) hours as needed. FOR SHORTNESS OF BREATH    Historical Provider, MD  buPROPion (WELLBUTRIN XL) 300 MG 24 hr tablet Take 300 mg by mouth daily.      Historical Provider, MD   Physical Exam: Filed Vitals:   07/12/11 1515 07/12/11 1540 07/12/11 1600 07/12/11 1800  BP: 97/71  109/72 107/78  Pulse: 54 61 64 56  Temp:  97.3 F (36.3 C)    TempSrc:  Oral    Resp: 9 16 13 16   SpO2: 96% 97% 96% 96%     General:  Appears calm and comfortable in the emergency department. Speech is fluent and clear. Systolic blood pressure 90s. Nontoxic.  Eyes: Pupils equal, round, reactive to light. Normal lids, irises, conjunctiva.  ENT: Hearing grossly normal. Lips and tongue appear remarkable. Edentulous.  Neck: No lymphadenopathy or masses. No thyromegaly.  Cardiovascular: Regular rate and rhythm. No murmur, rub, gallop. No lower extremity edema.  Respiratory: Clear to auscultation bilaterally. No wheezes, rales, rhonchi. Normal respiratory effort.  Abdomen: Soft, nontender, nondistended.  Skin: Small abrasions left leg below the knee. No erythema or significant lesions seen.  Musculoskeletal: There is significant edema of the left thigh, calf and foot. Left thigh calf and foot are tender to palpation. Sensation is intact from thigh to toe; capillary refill is brisk. No pallor. Pain  proportional.  Psychiatric: Grossly normal mood and affect. Speech fluent and appropriate. Oriented to himself, year, president. Disoriented to month.  Neurologic: Grossly normal tone and strength in the upper lower extremities bilaterally.  Labs on Admission:  Basic Metabolic Panel:  Lab 07/12/11 9604  NA 141  K 3.9  CL 103  CO2 27  GLUCOSE 93  BUN 16  CREATININE 1.17  CALCIUM 8.8  MG --  PHOS --    Liver Function Tests:  Lab 07/12/11 1346  AST 88*  ALT 144*  ALKPHOS 78  BILITOT 0.4  PROT 6.9  ALBUMIN 3.4*    Lab 07/12/11 1435  LIPASE 15  AMYLASE --    Lab 07/12/11 1435  AMMONIA 35    CBC:  Lab 07/12/11 1435 07/12/11 1346  WBC 6.4 7.2  NEUTROABS 2.9 3.5  HGB 11.0* 10.9*  HCT 32.5* 32.1*  MCV 96.2 95.8  PLT 251 252  Cardiac Enzymes:  Lab 07/12/11 1612 07/12/11 1435  CKTOTAL 1867* 2247*  CKMB 13.0* --  CKMBINDEX -- --  TROPONINI <0.30 --   Troponin [facility] Care Test)  Basename 07/12/11 1626  TROPIPOC 0.00   CBG:  Lab 07/12/11 1517 07/12/11 1252  GLUCAP 83 99   Radiological Exams on Admission: Dg Chest 2 View  07/12/2011  *RADIOLOGY REPORT*  Clinical Data: Adrenal mass  CHEST - 2 VIEW  Comparison: 10/22/2010  Findings: Stable postoperative changes and volume loss in the left hemithorax.  Lungs are clear and hyperaerated.  No pneumothorax. No pleural effusion.  IMPRESSION: Postoperative changes.  No active cardiopulmonary disease.  Original Report Authenticated By: Donavan Burnet, M.D.   Ct Head Wo Contrast  07/12/2011  *RADIOLOGY REPORT*  Clinical Data: 56 year old male with altered mental status and lethargy.  CT HEAD WITHOUT CONTRAST  Technique:  Contiguous axial images were obtained from the base of the skull through the vertex without contrast.  Comparison: 05/25/2007 head CT and 10/31/2009 MRI.  Findings: Mild generalized cerebral and cerebellar volume loss noted.  No acute intracranial abnormalities are identified, including mass  lesion or mass effect, hydrocephalus, extra-axial fluid collection, midline shift, hemorrhage, or acute infarction.  The visualized bony calvarium is unremarkable.  IMPRESSION: No evidence of acute intracranial abnormality.  Original Report Authenticated By: Rosendo Gros, M.D.   EKG: Independently reviewed. NSR, no acute changes.  Principal Problem:  *Rhabdomyolysis Active Problems:  Somnolence  Leg pain  Elevated LFTs  Anemia  Chronic pain   Assessment/Plan 1. Somnolence: Appears to be clearing in the emergency department. No evidence to suggest infection or sepsis at this time. I suspect this is related to the patient's chronic pain medication and anxiolytics. Borderline hypotension of unclear etiology--monitor on telemetry. IV fluids. 2. Left lower extremity pain/rhabdomyolysis: Aggressive IV fluids. Repeat basic metabolic panel and total CK in the morning. Currently no clinical findings to suggest acute compartment syndrome.  Check MRI of the leg to reevaluate the thigh and calf compartments. 3. Elevated transaminases: Secondary to rhabdomyolysis. Monitor. 4. Normocytic anemia: Stable. 5. Chronic pain: Appears stable.  Code Status: Full code Family Communication: Discussed with wife at bedside Disposition Plan: Home when improved  Brendia Sacks, MD  [facility] Pager [phone] If 8PM-8AM, please contact floor/night-coverage at [email or web address], password [facility] 07/12/2011, 6:10 PM

## 2011-07-12 NOTE — ED Notes (Signed)
Venous doppler at bedside.

## 2011-07-12 NOTE — ED Notes (Signed)
The patient's CBG was 99. 

## 2011-07-12 NOTE — Progress Notes (Addendum)
VASCULAR LAB PRELIMINARY  PRELIMINARY  PRELIMINARY  PRELIMINARY  Left lower extremity venous duplex completed.    Preliminary report:  Left:  No evidence of DVT,or superficial thrombosis. There is an area of mixed echoes in the popliteal fossa measuring 1.65 cm X 0.68 cm consistent with a Baker's cyst  Dynasia Kercheval D,    RVS   07/12/2011, 3:40 PM

## 2011-07-12 NOTE — ED Notes (Signed)
Received report from [facility]. No cardiac or respiratory distress. Family at bedside. Will continue to monitor.

## 2011-07-13 ENCOUNTER — Encounter (HOSPITAL_COMMUNITY): Payer: Self-pay

## 2011-07-13 DIAGNOSIS — R404 Transient alteration of awareness: Secondary | ICD-10-CM

## 2011-07-13 DIAGNOSIS — D649 Anemia, unspecified: Secondary | ICD-10-CM

## 2011-07-13 DIAGNOSIS — E870 Hyperosmolality and hypernatremia: Secondary | ICD-10-CM | POA: Diagnosis present

## 2011-07-13 DIAGNOSIS — M6282 Rhabdomyolysis: Secondary | ICD-10-CM

## 2011-07-13 DIAGNOSIS — M79609 Pain in unspecified limb: Secondary | ICD-10-CM

## 2011-07-13 LAB — COMPREHENSIVE METABOLIC PANEL
ALT: 94 U/L — ABNORMAL HIGH (ref 0–53)
AST: 52 U/L — ABNORMAL HIGH (ref 0–37)
Alkaline Phosphatase: 62 U/L (ref 39–117)
CO2: 26 mEq/L (ref 19–32)
Calcium: 8.1 mg/dL — ABNORMAL LOW (ref 8.4–10.5)
Chloride: 112 mEq/L (ref 96–112)
GFR calc Af Amer: >90 mL/min (ref >90)
GFR calc non Af Amer: 90 mL/min — ABNORMAL LOW (ref >90)
Glucose, Bld: 109 mg/dL — ABNORMAL HIGH (ref 70–99)
Potassium: 3.9 mEq/L (ref 3.5–5.1)
Sodium: 146 mEq/L — ABNORMAL HIGH (ref 135–145)

## 2011-07-13 LAB — CBC
Hemoglobin: 9.6 g/dL — ABNORMAL LOW (ref 13.0–17.0)
MCH: 33.1 pg (ref 26.0–34.0)
MCHC: 34.2 g/dL (ref 30.0–36.0)
RDW: 16.2 % — ABNORMAL HIGH (ref 11.5–15.5)

## 2011-07-13 LAB — CK: Total CK: 1173 U/L — ABNORMAL HIGH (ref 7–232)

## 2011-07-13 MED ORDER — SODIUM CHLORIDE 0.45 % IV SOLN
INTRAVENOUS | Status: DC
  Administered 2011-07-13: 09:00:00 via INTRAVENOUS

## 2011-07-13 NOTE — Progress Notes (Signed)
Joseph Torres to be D/C'd Home per MD order.  Discussed with the patient and all questions fully answered.   [name], [name]  Home Medication Instructions ZOX:[id number]   Printed on:07/13/11 1047  Medication Information                    zolpidem (AMBIEN) 10 MG tablet Take 10 mg by mouth at bedtime as needed. For sleep.           buPROPion (WELLBUTRIN XL) 300 MG 24 hr tablet Take 300 mg by mouth daily.             Tamsulosin HCl (FLOMAX) 0.4 MG CAPS Take 0.4 mg by mouth daily.             albuterol (PROVENTIL) (2.5 MG/3ML) 0.083% nebulizer solution Take 2.5 mg by nebulization every 6 (six) hours as needed. FOR SHORTNESS OF BREATH           albuterol (PROVENTIL HFA;VENTOLIN HFA) 108 (90 BASE) MCG/ACT inhaler Inhale 2 puffs into the lungs every 6 (six) hours as needed. FOR SHORTNESS OF BREATH           clonazePAM (KLONOPIN) 2 MG tablet Take 2 mg by mouth 2 (two) times daily.           pregabalin (LYRICA) 200 MG capsule Take 200 mg by mouth 4 (four) times daily.           oxymorphone (OPANA) 10 MG tablet Take 10 mg by mouth 3 (three) times daily.           oxymorphone (OPANA ER) 40 MG 12 hr tablet Take 40 mg by mouth every 8 (eight) hours.             VVS, Skin clean, dry and intact without evidence of skin break down, no evidence of skin tears noted. IV catheter discontinued intact. Site without signs and symptoms of complications. Dressing and pressure applied.  An After Visit Summary was printed and given to the patient. Patient escorted via WC, and D/C home via private auto.  Kennyth Arnold D 07/13/2011 10:47 AM

## 2011-07-13 NOTE — Progress Notes (Signed)
[facility] PROGRESS NOTE  Joseph Torres ZOX:[id number] DOB: 1955-02-08 DOA: 07/12/2011   Assessment/Plan: Patient Active Hospital Problem List: Rhabdomyolysis (07/12/2011)/Leg pain (07/12/2011) -his cardiac enzymes are trending down with IV fluids. It is reassuring that his pain is much improved physical examination he is able to walk with minimal difficulties. Have physical therapy evaluate him. -MRI came back that showed results as below. We'll followup with his primary care Dr.  Heide Scales (07/12/2011)   -now resolved probably secondary to narcotics to      Elevated LFTs (07/12/2011)  -agree with LFTs elevation is probably secondary to rhabdomyolysis he continues to resolve today.  Anemia (07/12/2011) -we'll follow with his primary care M.D. as an outpatient.  Chronic pain (07/12/2011)   follow with his pain clinic appointment as an outpatient.  Hypernatremia (07/13/2011)  we'll change his IV fluids to half normal saline. At the same rate.     Code Status: Full code Family Communication: Patient and wife Disposition Plan: Home today  Lambert Keto, MD  [facility] Pager [phone]  If 7PM-7AM, please contact night-coverage [email or web address] Password [email or web address] 07/13/2011, 8:10 AM   LOS: 1 day   Procedures:  None  Antibiotics:  None  Interim History:   Subjective: Patient relates that his right leg pain is much better. Has not progressively gotten worse. He actually relates has gotten better from yesterday to today. Able to ambulate without any difficulties.   Objective: Filed Vitals:   07/12/11 1945 07/12/11 2000 07/12/11 2030 07/13/11 0604  BP: 103/67 110/75 106/69 84/52  Pulse: 59 62 60 65  Temp:   97.7 F (36.5 C) 98 F (36.7 C)  TempSrc:   Oral Oral  Resp: 15 15 17 17   Height:   5\' 9"  (1.753 m)   Weight:   70.3 kg (154 lb 15.7 oz)   SpO2: 95% 96% 94% 91%    Intake/Output Summary (Last 24 hours) at 07/13/11 0810 Last data filed at  07/13/11 0604  Gross per 24 hour  Intake 1107.5 ml  Output    650 ml  Net  457.5 ml   Weight change:   Exam:  General: Alert, awake, oriented x3, in no acute distress.  HEENT: No bruits, no goiter.  Heart: Regular rate and rhythm, without murmurs, rubs, gallops.  Lungs: Good air movement, bilateral air movement.  Abdomen: Soft, nontender, nondistended, positive bowel sounds.  Neuro: Grossly intact, nonfocal.   Data Reviewed: Basic Metabolic Panel:  Lab 07/13/11 8295 07/12/11 1346  NA 146* 141  K 3.9 3.9  CL 112 103  CO2 26 27  GLUCOSE 109* 93  BUN 13 16  CREATININE 0.98 1.17  CALCIUM 8.1* 8.8  MG -- --  PHOS -- --   Liver Function Tests:  Lab 07/13/11 0500 07/12/11 1346  AST 52* 88*  ALT 94* 144*  ALKPHOS 62 78  BILITOT 0.2* 0.4  PROT 5.4* 6.9  ALBUMIN 2.6* 3.4*    Lab 07/12/11 1435  LIPASE 15  AMYLASE --    Lab 07/12/11 1435  AMMONIA 35   CBC:  Lab 07/13/11 0500 07/12/11 1435 07/12/11 1346  WBC 5.8 6.4 7.2  NEUTROABS -- 2.9 3.5  HGB 9.6* 11.0* 10.9*  HCT 28.1* 32.5* 32.1*  MCV 96.9 96.2 95.8  PLT 243 251 252   Cardiac Enzymes:  Lab 07/13/11 0500 07/12/11 1612 07/12/11 1435  CKTOTAL 1173* 1867* 2247*  CKMB -- 13.0* --  CKMBINDEX -- -- --  TROPONINI -- <0.30 --   BNP: No components  found with this basename: POCBNP:5 CBG:  Lab 07/12/11 1517 07/12/11 1252  GLUCAP 83 99    No results found for this or any previous visit (from the past 240 hour(s)).   Studies: Dg Chest 2 View  07/12/2011  *RADIOLOGY REPORT*  Clinical Data: Adrenal mass  CHEST - 2 VIEW  Comparison: 10/22/2010  Findings: Stable postoperative changes and volume loss in the left hemithorax.  Lungs are clear and hyperaerated.  No pneumothorax. No pleural effusion.  IMPRESSION: Postoperative changes.  No active cardiopulmonary disease.  Original Report Authenticated By: Donavan Burnet, M.D.   Ct Head Wo Contrast  07/12/2011  *RADIOLOGY REPORT*  Clinical Data: 56 year old male  with altered mental status and lethargy.  CT HEAD WITHOUT CONTRAST  Technique:  Contiguous axial images were obtained from the base of the skull through the vertex without contrast.  Comparison: 05/25/2007 head CT and 10/31/2009 MRI.  Findings: Mild generalized cerebral and cerebellar volume loss noted.  No acute intracranial abnormalities are identified, including mass lesion or mass effect, hydrocephalus, extra-axial fluid collection, midline shift, hemorrhage, or acute infarction.  The visualized bony calvarium is unremarkable.  IMPRESSION: No evidence of acute intracranial abnormality.  Original Report Authenticated By: Rosendo Gros, M.D.   Mr Tibia Fibula Left W Wo Contrast  07/13/2011  *RADIOLOGY REPORT*  Clinical Data:  Rhabdomyolysis.  Left leg swelling.  MRI OF THE LEFT LOWER LEG WITH AND WITHOUT CONTRAST  Technique:  Multiplanar, multisequence MR imaging of the left lower leg was performed before and after the administration of intravenous contrast.  Contrast: 15mL MULTIHANCE GADOBENATE DIMEGLUMINE 529 MG/ML IV SOLN  Comparison:   None.  Findings: There is circumferential edema in the subcutaneous soft tissues of the left lower leg.  Osseous structures are normal.  No ankle or knee effusion.  No myositis or myonecrosis in the left lower leg.  IMPRESSION: Subcutaneous edema in the left lower leg.  No myonecrosis in the left lower leg.  Original Report Authenticated By: Gwynn Burly, M.D.   Mr Femur Left W Wo Contrast  07/13/2011  *RADIOLOGY REPORT*  Clinical Data:  Rhabdomyolysis.  The left leg pain and swelling.  MRI OF THE LEFT THIGH WITH AND WITHOUT CONTRAST  Technique:  Multiplanar, multisequence MR imaging of the left thigh was performed before and after the administration of intravenous contrast.  Contrast: 15mL MULTIHANCE GADOBENATE DIMEGLUMINE 529 MG/ML IV SOLN  Comparison:   None.  Findings: The the patient has multiple areas of myositis as well as focal myonecrosis in the muscles of  both buttocks and well as in both thighs, much worse on the left than right.  The left gluteus minimus and medius muscles are markedly edematous with focal areas of myonecrosis of the gluteus medius muscle.  There is also myositis in the left abductor muscles and in the vastus lateralis muscle and in the proximal biceps femoris.  Most extensive myonecrosis is in the left abductor brevis muscle.  The patient also has small focal areas of myositis in the proximal right vastus lateralis as well as in the right gluteus medius and minimus muscles.  There is diffuse subcutaneous edema in the left thigh as well as edema along the superficial and deep fascial planes.  No significant abnormality of the osseous structures.  Slight osteoarthritis of the left hip.  The patient does have edema along the left iliopsoas muscle in the pelvis as well as presacral edema.  There is very slight edema along the fascial planes between the  right psoas muscle and the right iliacus muscle.  IMPRESSION:  1.  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 2.  Focal myonecrosis in the left gluteus medius and abductor brevis muscles. 3. Less severe myositis involving the left iliopsoas muscle in the pelvis and presacral edema and diffuse superficial and deep soft tissue edema in the left thigh.  Original Report Authenticated By: Gwynn Burly, M.D.    Scheduled Meds:    . buPROPion  300 mg Oral Daily  . morphine  100 mg Oral Q8H  . sertraline  50 mg Oral Daily  . sodium chloride  3 mL Intravenous Q12H  . Tamsulosin HCl  0.4 mg Oral Daily  . DISCONTD: oxymorphone  40 mg Oral Q8H   Continuous Infusions:    . sodium chloride    . DISCONTD: sodium chloride 125 mL/hr (07/12/11 1545)  . DISCONTD: sodium chloride 160 mL/hr at 07/12/11 1835  . DISCONTD: sodium chloride 150 mL/hr at 07/12/11 2241
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
## 2011-07-14 ENCOUNTER — Emergency Department (HOSPITAL_COMMUNITY): Payer: Medicare Other

## 2011-07-14 ENCOUNTER — Encounter (HOSPITAL_COMMUNITY): Payer: Self-pay | Admitting: Emergency Medicine

## 2011-07-14 ENCOUNTER — Emergency Department (HOSPITAL_COMMUNITY)
Admission: EM | Admit: 2011-07-14 | Discharge: 2011-07-14 | Disposition: A | Discharge status: Home / Self Care | Payer: Medicare Other | Attending: Emergency Medicine | Admitting: Emergency Medicine

## 2011-07-14 DIAGNOSIS — S139XXA Sprain of joints and ligaments of unspecified parts of neck, initial encounter: Secondary | ICD-10-CM | POA: Insufficient documentation

## 2011-07-14 DIAGNOSIS — S0990XA Unspecified injury of head, initial encounter: Secondary | ICD-10-CM

## 2011-07-14 DIAGNOSIS — M542 Cervicalgia: Secondary | ICD-10-CM | POA: Insufficient documentation

## 2011-07-14 DIAGNOSIS — R10819 Abdominal tenderness, unspecified site: Secondary | ICD-10-CM | POA: Insufficient documentation

## 2011-07-14 DIAGNOSIS — Z981 Arthrodesis status: Secondary | ICD-10-CM | POA: Insufficient documentation

## 2011-07-14 DIAGNOSIS — Z85118 Personal history of other malignant neoplasm of bronchus and lung: Secondary | ICD-10-CM | POA: Insufficient documentation

## 2011-07-14 DIAGNOSIS — S7010XA Contusion of unspecified thigh, initial encounter: Secondary | ICD-10-CM

## 2011-07-14 DIAGNOSIS — Q619 Cystic kidney disease, unspecified: Secondary | ICD-10-CM | POA: Insufficient documentation

## 2011-07-14 DIAGNOSIS — M7989 Other specified soft tissue disorders: Secondary | ICD-10-CM | POA: Insufficient documentation

## 2011-07-14 DIAGNOSIS — M545 Low back pain, unspecified: Secondary | ICD-10-CM | POA: Insufficient documentation

## 2011-07-14 DIAGNOSIS — S161XXA Strain of muscle, fascia and tendon at neck level, initial encounter: Secondary | ICD-10-CM

## 2011-07-14 LAB — CBC
HCT: 29.4 % — ABNORMAL LOW (ref 39.0–52.0)
MCHC: 33.7 g/dL (ref 30.0–36.0)
MCV: 97 fL (ref 78.0–100.0)
Platelets: 271 10*3/uL (ref 150–400)
RDW: 16.2 % — ABNORMAL HIGH (ref 11.5–15.5)
WBC: 7.9 10*3/uL (ref 4.0–10.5)

## 2011-07-14 LAB — COMPREHENSIVE METABOLIC PANEL
ALT: 92 U/L — ABNORMAL HIGH (ref 0–53)
AST: 47 U/L — ABNORMAL HIGH (ref 0–37)
Alkaline Phosphatase: 82 U/L (ref 39–117)
CO2: 26 mEq/L (ref 19–32)
Chloride: 108 mEq/L (ref 96–112)
GFR calc Af Amer: >90 mL/min (ref >90)
GFR calc non Af Amer: >90 mL/min (ref >90)
Glucose, Bld: 99 mg/dL (ref 70–99)
Sodium: 143 mEq/L (ref 135–145)
Total Bilirubin: 0.2 mg/dL — ABNORMAL LOW (ref 0.3–1.2)

## 2011-07-14 MED ORDER — OXYCODONE-ACETAMINOPHEN 5-325 MG PO TABS: 1.0000 | ORAL_TABLET | Freq: Four times a day (QID) | ORAL | Status: AC | PRN

## 2011-07-14 MED ORDER — MORPHINE SULFATE 4 MG/ML IJ SOLN
4.0000 mg | Freq: Once | INTRAMUSCULAR | Status: AC
  Administered 2011-07-14: 4 mg via INTRAVENOUS
  Filled 2011-07-14: qty 1

## 2011-07-14 MED ORDER — IOHEXOL 300 MG/ML  SOLN
80.0000 mL | Freq: Once | INTRAMUSCULAR | Status: AC | PRN
  Administered 2011-07-14: 80 mL via INTRAVENOUS
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
## 2011-07-14 NOTE — ED Notes (Signed)
Patient transported to X-ray 
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
## 2011-07-14 NOTE — ED Notes (Signed)
Pt placed back on monitor, continuous pulse oximetry, blood pressure cuff and oxygen [place] (2L); [employer] officers at bedside with phlebotomist
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
## 2011-07-14 NOTE — ED Notes (Signed)
C-Spine Cleared. Dr. Hyman Hopes at bedside.
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
## 2011-07-14 NOTE — Progress Notes (Signed)
Utilization review completed.  

## 2011-07-21 ENCOUNTER — Other Ambulatory Visit: Payer: Self-pay | Admitting: Thoracic Surgery (Cardiothoracic Vascular Surgery)

## 2011-07-21 DIAGNOSIS — C3490 Malignant neoplasm of unspecified part of unspecified bronchus or lung: Secondary | ICD-10-CM

## 2011-08-17 ENCOUNTER — Ambulatory Visit: Payer: Medicare Other | Admitting: Thoracic Surgery (Cardiothoracic Vascular Surgery)

## 2011-08-17 ENCOUNTER — Other Ambulatory Visit: Payer: Medicare Other

## 2011-09-30 ENCOUNTER — Inpatient Hospital Stay (HOSPITAL_COMMUNITY)
Admission: EM | Admit: 2011-09-30 | Discharge: 2011-10-01 | DRG: 558 | Discharge status: Against Medical Advice | Payer: Medicare Other | Attending: Internal Medicine | Admitting: Internal Medicine

## 2011-09-30 ENCOUNTER — Emergency Department (HOSPITAL_COMMUNITY): Payer: Medicare Other

## 2011-09-30 ENCOUNTER — Encounter (HOSPITAL_COMMUNITY): Payer: Self-pay

## 2011-09-30 DIAGNOSIS — R29898 Other symptoms and signs involving the musculoskeletal system: Secondary | ICD-10-CM | POA: Diagnosis present

## 2011-09-30 DIAGNOSIS — J449 Chronic obstructive pulmonary disease, unspecified: Secondary | ICD-10-CM

## 2011-09-30 DIAGNOSIS — Z79899 Other long term (current) drug therapy: Secondary | ICD-10-CM

## 2011-09-30 DIAGNOSIS — D649 Anemia, unspecified: Secondary | ICD-10-CM

## 2011-09-30 DIAGNOSIS — F411 Generalized anxiety disorder: Secondary | ICD-10-CM | POA: Diagnosis present

## 2011-09-30 DIAGNOSIS — G8929 Other chronic pain: Secondary | ICD-10-CM | POA: Diagnosis present

## 2011-09-30 DIAGNOSIS — M6282 Rhabdomyolysis: Principal | ICD-10-CM

## 2011-09-30 DIAGNOSIS — F1110 Opioid abuse, uncomplicated: Secondary | ICD-10-CM | POA: Diagnosis present

## 2011-09-30 DIAGNOSIS — M79606 Pain in leg, unspecified: Secondary | ICD-10-CM

## 2011-09-30 DIAGNOSIS — J4489 Other specified chronic obstructive pulmonary disease: Secondary | ICD-10-CM

## 2011-09-30 DIAGNOSIS — Z85118 Personal history of other malignant neoplasm of bronchus and lung: Secondary | ICD-10-CM

## 2011-09-30 DIAGNOSIS — R4182 Altered mental status, unspecified: Secondary | ICD-10-CM

## 2011-09-30 DIAGNOSIS — F17200 Nicotine dependence, unspecified, uncomplicated: Secondary | ICD-10-CM | POA: Diagnosis present

## 2011-09-30 LAB — POCT I-STAT, CHEM 8
Calcium, Ion: 1.19 mmol/L (ref 1.12–1.23)
Chloride: 108 mEq/L (ref 96–112)
Creatinine, Ser: 1 mg/dL (ref 0.50–1.35)
Glucose, Bld: 91 mg/dL (ref 70–99)
Potassium: 4.1 mEq/L (ref 3.5–5.1)

## 2011-09-30 LAB — CBC WITH DIFFERENTIAL/PLATELET
Basophils Absolute: 0 10*3/uL (ref 0.0–0.1)
Basophils Relative: 0 % (ref 0–1)
Eosinophils Absolute: 0.4 10*3/uL (ref 0.0–0.7)
MCH: 31.8 pg (ref 26.0–34.0)
MCHC: 32.5 g/dL (ref 30.0–36.0)
Monocytes Relative: 11 % (ref 3–12)
Neutro Abs: 5.1 10*3/uL (ref 1.7–7.7)
Neutrophils Relative %: 57 % (ref 43–77)
Platelets: 172 10*3/uL (ref 150–400)
RDW: 15.8 % — ABNORMAL HIGH (ref 11.5–15.5)

## 2011-09-30 LAB — URINALYSIS, MICROSCOPIC ONLY
Leukocytes, UA: NEGATIVE
Nitrite: NEGATIVE
Specific Gravity, Urine: 1.015 (ref 1.005–1.030)
Urobilinogen, UA: 0.2 mg/dL (ref 0.0–1.0)

## 2011-09-30 LAB — CREATININE, SERUM
Creatinine, Ser: 0.81 mg/dL (ref 0.50–1.35)
GFR calc non Af Amer: >90 mL/min (ref >90)

## 2011-09-30 LAB — COMPREHENSIVE METABOLIC PANEL
AST: 176 U/L — ABNORMAL HIGH (ref 0–37)
Albumin: 3.5 g/dL (ref 3.5–5.2)
Alkaline Phosphatase: 99 U/L (ref 39–117)
BUN: 15 mg/dL (ref 6–23)
Chloride: 107 mEq/L (ref 96–112)
Potassium: 4.1 mEq/L (ref 3.5–5.1)
Sodium: 140 mEq/L (ref 135–145)
Total Protein: 6.7 g/dL (ref 6.0–8.3)

## 2011-09-30 LAB — RAPID URINE DRUG SCREEN, HOSP PERFORMED
Benzodiazepines: POSITIVE — AB
Cocaine: NOT DETECTED
Opiates: NOT DETECTED
Tetrahydrocannabinol: POSITIVE — AB

## 2011-09-30 LAB — CBC
Hemoglobin: 10.5 g/dL — ABNORMAL LOW (ref 13.0–17.0)
Platelets: 160 10*3/uL (ref 150–400)
RBC: 3.24 MIL/uL — ABNORMAL LOW (ref 4.22–5.81)

## 2011-09-30 LAB — PHOSPHORUS: Phosphorus: 3 mg/dL (ref 2.3–4.6)

## 2011-09-30 LAB — AMMONIA: Ammonia: 33 umol/L (ref 11–60)

## 2011-09-30 LAB — APTT: aPTT: 32 seconds (ref 24–37)

## 2011-09-30 LAB — RETICULOCYTES: Retic Ct Pct: 0.7 % (ref 0.4–3.1)

## 2011-09-30 LAB — CK: Total CK: 12933 U/L — ABNORMAL HIGH (ref 7–232)

## 2011-09-30 MED ORDER — SODIUM CHLORIDE 0.9 % IV BOLUS (SEPSIS)
1000.0000 mL | Freq: Once | INTRAVENOUS | Status: AC
  Administered 2011-09-30: 1000 mL via INTRAVENOUS

## 2011-09-30 MED ORDER — ONDANSETRON HCL 4 MG/2ML IJ SOLN: 4.0000 mg | Freq: Three times a day (TID) | INTRAMUSCULAR | Status: DC | PRN

## 2011-09-30 MED ORDER — SODIUM CHLORIDE 0.9 % IV SOLN
INTRAVENOUS | Status: DC
  Administered 2011-09-30 – 2011-10-01 (×2): via INTRAVENOUS

## 2011-09-30 MED ORDER — PREGABALIN 75 MG PO CAPS: 200.0000 mg | ORAL_CAPSULE | Freq: Three times a day (TID) | ORAL | Status: DC

## 2011-09-30 MED ORDER — ZOLPIDEM TARTRATE 5 MG PO TABS: 10.0000 mg | ORAL_TABLET | Freq: Every evening | ORAL | Status: DC | PRN

## 2011-09-30 MED ORDER — BUPROPION HCL ER (XL) 300 MG PO TB24
300.0000 mg | ORAL_TABLET | Freq: Every day | ORAL | Status: DC
  Administered 2011-10-01: 300 mg via ORAL
  Filled 2011-09-30: qty 1

## 2011-09-30 MED ORDER — MOXIFLOXACIN HCL IN NACL 400 MG/250ML IV SOLN
400.0000 mg | Freq: Every day | INTRAVENOUS | Status: DC
  Administered 2011-09-30: 400 mg via INTRAVENOUS
  Filled 2011-09-30 (×2): qty 250

## 2011-09-30 MED ORDER — ALBUTEROL SULFATE HFA 108 (90 BASE) MCG/ACT IN AERS: 2.0000 | INHALATION_SPRAY | Freq: Four times a day (QID) | RESPIRATORY_TRACT | Status: DC | PRN

## 2011-09-30 MED ORDER — OXYMORPHONE HCL 10 MG PO TABS: 10.0000 mg | ORAL_TABLET | Freq: Three times a day (TID) | ORAL | Status: DC

## 2011-09-30 MED ORDER — OXYMORPHONE HCL ER 10 MG PO T12A
40.0000 mg | EXTENDED_RELEASE_TABLET | Freq: Three times a day (TID) | ORAL | Status: DC
  Administered 2011-09-30 – 2011-10-01 (×3): 40 mg via ORAL
  Filled 2011-09-30 (×3): qty 4

## 2011-09-30 MED ORDER — TAMSULOSIN HCL 0.4 MG PO CAPS
0.4000 mg | ORAL_CAPSULE | Freq: Every day | ORAL | Status: DC
  Administered 2011-10-01: 0.4 mg via ORAL
  Filled 2011-09-30: qty 1

## 2011-09-30 MED ORDER — ALBUTEROL SULFATE (5 MG/ML) 0.5% IN NEBU
2.5000 mg | INHALATION_SOLUTION | RESPIRATORY_TRACT | Status: DC
  Administered 2011-10-01: 2.5 mg via RESPIRATORY_TRACT

## 2011-09-30 MED ORDER — CLONAZEPAM 1 MG PO TABS
2.0000 mg | ORAL_TABLET | Freq: Two times a day (BID) | ORAL | Status: DC
  Administered 2011-09-30 – 2011-10-01 (×2): 2 mg via ORAL
  Filled 2011-09-30 (×2): qty 2

## 2011-09-30 MED ORDER — HEPARIN SODIUM (PORCINE) 5000 UNIT/ML IJ SOLN
5000.0000 [IU] | Freq: Three times a day (TID) | INTRAMUSCULAR | Status: DC
  Filled 2011-09-30 (×4): qty 1

## 2011-09-30 MED ORDER — SODIUM CHLORIDE 0.9 % IV SOLN: INTRAVENOUS | Status: DC

## 2011-09-30 MED ORDER — NALOXONE HCL 0.4 MG/ML IJ SOLN
0.4000 mg | Freq: Once | INTRAMUSCULAR | Status: AC
  Administered 2011-09-30: 0.4 mg via INTRAVENOUS
  Filled 2011-09-30: qty 1

## 2011-09-30 NOTE — ED Notes (Signed)
Pt MAE, pt unable to stay awake in triage

## 2011-09-30 NOTE — ED Notes (Signed)
Pt encouraged to urinate for urinalysis sample

## 2011-09-30 NOTE — ED Provider Notes (Signed)
History     CSN: [id number]  Arrival date & time 09/30/11  1544   First MD Initiated Contact with Patient 09/30/11 1606      Chief Complaint  Patient presents with  . Weakness    (Consider location/radiation/quality/duration/timing/severity/associated sxs/prior treatment) HPI Pt p/w AMS and RUE/RLE weakness starting yesterday evening. Last seen normal 2230. Pt spouse found him in his current state at 1530 today. Pt denies current pain but is quite drowsy and history from patient is limited.  Per review of chart, pt admitted in 6/13 for similar symptoms with negative CT head and MRI for stroke. Symptoms thought to be related to self admission of pain medications Past Medical History  Diagnosis Date  . Asthma   . COPD (chronic obstructive pulmonary disease)   . S/P lobectomy of lung left  . Chronic pain   . Chronic anxiety   . Hx: recurrent pneumonia     WITH RESPIRATORY FAILURE, ASSOCIATED WITH SEPSIS IN 2009,   . 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 Pleural effusion, left   . Pericardial effusion   . Lung cancer     STAGE I A, LEFT UPPER LOBE NODULE     Past Surgical History  Procedure Date  . Cervi al spine surgery 1997  . Rotator cuff surgery rt side 1996  . Left elbow surgery     FOR MEDIAN NERVE PALSY  . Left upper lobectomy 08/25/2006    [facility]    History reviewed. No pertinent family history.  History  Substance Use Topics  . Smoking status: Current Some Day Smoker -- 0.2 packs/day for 40 years    Types: Cigarettes  . Smokeless tobacco: Never Used  . Alcohol Use: No      Review of Systems  HENT: Negative for neck pain and neck stiffness.   Respiratory: Negative for cough and shortness of breath.   Cardiovascular: Negative for chest pain.  Gastrointestinal: Negative for nausea, vomiting, abdominal pain and diarrhea.  Musculoskeletal: Negative for back pain.  Skin: Negative for rash and wound.  Neurological:  Positive for weakness. Negative for light-headedness, numbness and headaches.  Psychiatric/Behavioral: Positive for confusion.    Allergies  Aspirin; Codeine; Penicillins; and Tylenol  Home Medications   Current Outpatient Rx  Name Route Sig Dispense Refill  . ALBUTEROL SULFATE HFA 108 (90 BASE) MCG/ACT IN AERS Inhalation Inhale 2 puffs into the lungs every 6 (six) hours as needed. FOR SHORTNESS OF BREATH    . ALBUTEROL SULFATE (2.5 MG/3ML) 0.083% IN NEBU Nebulization Take 2.5 mg by nebulization every 6 (six) hours as needed. FOR SHORTNESS OF BREATH    . BUPROPION HCL ER (XL) 300 MG PO TB24 Oral Take 300 mg by mouth daily.     Marland Kitchen CLONAZEPAM 2 MG PO TABS Oral Take 2 mg by mouth 2 (two) times daily.    Marland Kitchen OXYMORPHONE HCL ER 40 MG PO TB12 Oral Take 40 mg by mouth every 8 (eight) hours.    . OXYMORPHONE HCL 10 MG PO TABS Oral Take 10 mg by mouth 3 (three) times daily. Also takes opana 40mg  per family member    . PREGABALIN 200 MG PO CAPS Oral Take 200 mg by mouth 4 (four) times daily.    Marland Kitchen TAMSULOSIN HCL 0.4 MG PO CAPS Oral Take 0.4 mg by mouth daily.      Marland Kitchen ZOLPIDEM TARTRATE 10 MG PO TABS Oral Take 10 mg by mouth at  bedtime as needed. For sleep.      BP 131/83  Pulse 96  Resp 22  SpO2 97%  Physical Exam  Nursing note and vitals reviewed. Constitutional: He appears well-developed and well-nourished. No distress.       Drowsy, has to be continually stimulated to get response  HENT:  Head: Normocephalic and atraumatic.  Mouth/Throat: Oropharynx is clear and moist.       No evidence of trauma  Eyes: EOM are normal. Pupils are equal, round, and reactive to light.       bl pinpoint pupils  Neck: Normal range of motion. Neck supple.       No meningismus   Cardiovascular: Normal rate and regular rhythm.   Pulmonary/Chest: Effort normal and breath sounds normal. No respiratory distress. He has no wheezes. He has no rales.  Abdominal: Soft. Bowel sounds are normal. There is no tenderness.  There is no rebound and no guarding.  Musculoskeletal: Normal range of motion. He exhibits no edema and no tenderness.  Neurological:       Drowsy, oriented to person, 4/5 RUE motor, 5/5 LUE motor, 2/5 RLE motor, 4/5 LLE motor. Sensation grossly intact. Exam limited due to AMS  Skin: Skin is warm and dry. No rash noted. No erythema.    ED Course  Procedures (including critical care time)  Labs Reviewed  CBC WITH DIFFERENTIAL - Abnormal; Notable for the following:    RBC 3.43 (*)     Hemoglobin 10.9 (*)     HCT 33.5 (*)     RDW 15.8 (*)     All other components within normal limits  COMPREHENSIVE METABOLIC PANEL - Abnormal; Notable for the following:    AST 176 (*)     ALT 58 (*)     Total Bilirubin 0.1 (*)     All other components within normal limits  POCT I-STAT, CHEM 8 - Abnormal; Notable for the following:    Hemoglobin 11.9 (*)     HCT 35.0 (*)     All other components within normal limits  CK - Abnormal; Notable for the following:    Total CK 12933 (*)     All other components within normal limits  APTT  PROTIME-INR  ETHANOL  POCT I-STAT TROPONIN I  AMMONIA  URINALYSIS, ROUTINE W REFLEX MICROSCOPIC  URINE RAPID DRUG SCREEN (HOSP PERFORMED)  URINALYSIS, WITH MICROSCOPIC   Ct Head Wo Contrast  09/30/2011  *RADIOLOGY REPORT*  Clinical Data: Right sided arm weakness.  CT HEAD WITHOUT CONTRAST  Technique:  Contiguous axial images were obtained from the base of the skull through the vertex without contrast.  Comparison: Head CT 07/14/2011.  Findings: Mild cerebral and cerebellar atrophy.  There are some very mild areas of decreased attenuation throughout the deep and periventricular white matter of the cerebral hemispheres bilaterally, most compatible with mild chronic microvascular ischemic disease. No definite evidence of acute/subacute cerebral ischemia, no acute intracerebral hemorrhage, no focal mass, mass effect, hydrocephalus or abnormal intra or extra-axial fluid  collections.  No acute displaced skull fractures are identified. Visualized paranasal sinuses and mastoids are generally well pneumatized, with exception of a small amount of mucosal thickening throughout the ethmoid sinuses bilaterally.  IMPRESSION: 1.  No acute intracranial abnormalities. 2.  Mild cerebral and cerebellar atrophy with mild chronic microvascular ischemic changes of the white matter, as above. 3.  Mild mucosal thickening throughout the ethmoid sinuses bilaterally, without air fluid levels or other acute features.   Original Report Authenticated  By: Florencia Reasons, M.D.    Dg Chest Port 1 View  09/30/2011  *RADIOLOGY REPORT*  Clinical Data: Altered mental status  PORTABLE CHEST - 1 VIEW  Comparison: 07/12/2011  Findings: Mild patchy left lower lobe opacity, likely atelectasis, pneumonia not excluded.  Stable elevation of the left hemidiaphragm.  Right lung is essentially clear.  No pleural effusion or pneumothorax.  Cardiomediastinal silhouette is within normal limits.  IMPRESSION: Mild patchy left lower lobe opacity, likely atelectasis, pneumonia not excluded.   Original Report Authenticated By: Charline Bills, M.D.      1. Altered mental status   2. Rhabdomyolysis   3. Right leg weakness       MDM   Pt more alert after narcan, BP improved. Now nauseated. Continues to have RLE weakness. Discussed with [facility] and will admit      Loren Racer, MD 09/30/11 2011

## 2011-09-30 NOTE — ED Notes (Signed)
Pt reports waking 1120 rgis am with (R) side paralysis, spouse reports she found pt approx 1530 when she arrived home from work. Pt presents w/BLE swelling, swelling to (L) eye. Spouse reports pt LSN last night 2230. Pt a/o x3, pt's bp 82/57, and manually 80/60, pt reports we are in the "8th" month

## 2011-09-30 NOTE — ED Notes (Signed)
Pt changed into gown & aware of urine sample, unable to void at this time, urinal at bedside.

## 2011-09-30 NOTE — ED Notes (Signed)
Pt refuses insertion of urinary catheter, Ranae Palms, MD notified

## 2011-09-30 NOTE — H&P (Addendum)
Joseph Torres is an 56 y.o. male. Patient was seen and examined on September 30, 2011 at 9:45 PM. PCP - Dr. Mikeal Hawthorne. Chief Complaint: Right lower extremity weakness and pain. HPI: 56 year old male with history of lung cancer status post resection in 2008, ongoing tobacco abuse and COPD presented with complaints of weakness in the right lower extremity with pain which started off today morning. He noticed the pain in his leg with weakness since he woke up in the morning. He also gradually developed pain in the left leg distal part with swelling. Denies any trauma insect bite fever chills. In the ER initially was found to be little lethargic for which was given one dose of Narcan and patient became alert. CT head is negative. Patient's leg on exam does have swelling and also has mild tenderness but has good intact pulses. The left leg has swelling of distal part. Patient had similar symptoms 3 months ago and at that time MRI done at an out of hospital facility as per the notes showed proximal muscle necrosis. At this time patient CK is also elevated. Last time 3 months ago his CK was around 20,000.  Past Medical History  Diagnosis Date  . Asthma   . COPD (chronic obstructive pulmonary disease)   . S/P lobectomy of lung left  . Chronic pain   . Chronic anxiety   . Hx: recurrent pneumonia     WITH RESPIRATORY FAILURE, ASSOCIATED WITH SEPSIS IN 2009,   . 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 Pleural effusion, left   . Pericardial effusion   . Lung cancer     STAGE I A, LEFT UPPER LOBE NODULE     Past Surgical History  Procedure Date  . Cervi al spine surgery 1997  . Rotator cuff surgery rt side 1996  . Left elbow surgery     FOR MEDIAN NERVE PALSY  . Left upper lobectomy 08/25/2006    [facility]    History reviewed. No pertinent family history. Social History:  reports that he has been smoking Cigarettes.  He has a 10 pack-year smoking history. He  has never used smokeless tobacco. He reports that he uses illicit drugs (Marijuana). He reports that he does not drink alcohol.  Allergies:  Allergies  Allergen Reactions  . Aspirin     REACTION: No history of reaction.  No history of PUD.  Told to stay away from ASA, suspect secondary to other NSAID use.  . Codeine     REACTION: GI intolerance  . Penicillins     REACTION: swells  . Tylenol (Acetaminophen)     unknown    Medications Prior to Admission  Medication Sig Dispense Refill  . albuterol (PROVENTIL HFA;VENTOLIN HFA) 108 (90 BASE) MCG/ACT inhaler Inhale 2 puffs into the lungs every 6 (six) hours as needed. FOR SHORTNESS OF BREATH      . albuterol (PROVENTIL) (2.5 MG/3ML) 0.083% nebulizer solution Take 2.5 mg by nebulization every 6 (six) hours as needed. FOR SHORTNESS OF BREATH      . buPROPion (WELLBUTRIN XL) 300 MG 24 hr tablet Take 300 mg by mouth daily.       . clonazePAM (KLONOPIN) 2 MG tablet Take 2 mg by mouth 2 (two) times daily.      Marland Kitchen oxymorphone (OPANA ER) 40 MG 12 hr tablet Take 40 mg by mouth every 8 (eight) hours.      Marland Kitchen oxymorphone (OPANA) 10 MG  tablet Take 10 mg by mouth 3 (three) times daily. Also takes opana 40mg  per family member      . pregabalin (LYRICA) 200 MG capsule Take 200 mg by mouth 4 (four) times daily.      . Tamsulosin HCl (FLOMAX) 0.4 MG CAPS Take 0.4 mg by mouth daily.        Marland Kitchen zolpidem (AMBIEN) 10 MG tablet Take 10 mg by mouth at bedtime as needed. For sleep.        Results for orders placed during the hospital encounter of 09/30/11 (from the past 48 hour(s))  CBC WITH DIFFERENTIAL     Status: Abnormal   Collection Time   09/30/11  3:50 PM      Component Value Range Comment   WBC 9.0  4.0 - 10.5 K/uL    RBC 3.43 (*) 4.22 - 5.81 MIL/uL    Hemoglobin 10.9 (*) 13.0 - 17.0 g/dL    HCT 16.1 (*) 09.6 - 52.0 %    MCV 97.7  78.0 - 100.0 fL    MCH 31.8  26.0 - 34.0 pg    MCHC 32.5  30.0 - 36.0 g/dL    RDW 04.5 (*) 40.9 - 15.5 %    Platelets 172   150 - 400 K/uL    Neutrophils Relative 57  43 - 77 %    Neutro Abs 5.1  1.7 - 7.7 K/uL    Lymphocytes Relative 28  12 - 46 %    Lymphs Abs 2.5  0.7 - 4.0 K/uL    Monocytes Relative 11  3 - 12 %    Monocytes Absolute 1.0  0.1 - 1.0 K/uL    Eosinophils Relative 4  0 - 5 %    Eosinophils Absolute 0.4  0.0 - 0.7 K/uL    Basophils Relative 0  0 - 1 %    Basophils Absolute 0.0  0.0 - 0.1 K/uL   COMPREHENSIVE METABOLIC PANEL     Status: Abnormal   Collection Time   09/30/11  3:50 PM      Component Value Range Comment   Sodium 140  135 - 145 mEq/L    Potassium 4.1  3.5 - 5.1 mEq/L    Chloride 107  96 - 112 mEq/L    CO2 26  19 - 32 mEq/L    Glucose, Bld 90  70 - 99 mg/dL    BUN 15  6 - 23 mg/dL    Creatinine, Ser 8.11  0.50 - 1.35 mg/dL    Calcium 9.1  8.4 - 91.4 mg/dL    Total Protein 6.7  6.0 - 8.3 g/dL    Albumin 3.5  3.5 - 5.2 g/dL    AST 782 (*) 0 - 37 U/L    ALT 58 (*) 0 - 53 U/L    Alkaline Phosphatase 99  39 - 117 U/L    Total Bilirubin 0.1 (*) 0.3 - 1.2 mg/dL    GFR calc non Af Amer >90  >90 mL/min    GFR calc Af Amer >90  >90 mL/min   APTT     Status: Normal   Collection Time   09/30/11  3:50 PM      Component Value Range Comment   aPTT 32  24 - 37 seconds   PROTIME-INR     Status: Normal   Collection Time   09/30/11  3:50 PM      Component Value Range Comment   Prothrombin Time 14.1  11.6 -  15.2 seconds    INR 1.07  0.00 - 1.49   ETHANOL     Status: Normal   Collection Time   09/30/11  3:50 PM      Component Value Range Comment   Alcohol, Ethyl (B) <11  0 - 11 mg/dL   POCT I-STAT TROPONIN I     Status: Normal   Collection Time   09/30/11  4:45 PM      Component Value Range Comment   Troponin i, poc 0.01  0.00 - 0.08 ng/mL    Comment 3            POCT I-STAT, CHEM 8     Status: Abnormal   Collection Time   09/30/11  4:49 PM      Component Value Range Comment   Sodium 144  135 - 145 mEq/L    Potassium 4.1  3.5 - 5.1 mEq/L    Chloride 108  96 - 112 mEq/L    BUN 15  6 -  23 mg/dL    Creatinine, Ser 4.09  0.50 - 1.35 mg/dL    Glucose, Bld 91  70 - 99 mg/dL    Calcium, Ion 8.11  9.14 - 1.23 mmol/L    TCO2 24  0 - 100 mmol/L    Hemoglobin 11.9 (*) 13.0 - 17.0 g/dL    HCT 78.2 (*) 95.6 - 52.0 %   AMMONIA     Status: Normal   Collection Time   09/30/11  6:03 PM      Component Value Range Comment   Ammonia 33  11 - 60 umol/L   CK     Status: Abnormal   Collection Time   09/30/11  6:03 PM      Component Value Range Comment   Total CK 12933 (*) 7 - 232 U/L    Ct Head Wo Contrast  09/30/2011  *RADIOLOGY REPORT*  Clinical Data: Right sided arm weakness.  CT HEAD WITHOUT CONTRAST  Technique:  Contiguous axial images were obtained from the base of the skull through the vertex without contrast.  Comparison: Head CT 07/14/2011.  Findings: Mild cerebral and cerebellar atrophy.  There are some very mild areas of decreased attenuation throughout the deep and periventricular white matter of the cerebral hemispheres bilaterally, most compatible with mild chronic microvascular ischemic disease. No definite evidence of acute/subacute cerebral ischemia, no acute intracerebral hemorrhage, no focal mass, mass effect, hydrocephalus or abnormal intra or extra-axial fluid collections.  No acute displaced skull fractures are identified. Visualized paranasal sinuses and mastoids are generally well pneumatized, with exception of a small amount of mucosal thickening throughout the ethmoid sinuses bilaterally.  IMPRESSION: 1.  No acute intracranial abnormalities. 2.  Mild cerebral and cerebellar atrophy with mild chronic microvascular ischemic changes of the white matter, as above. 3.  Mild mucosal thickening throughout the ethmoid sinuses bilaterally, without air fluid levels or other acute features.   Original Report Authenticated By: Florencia Reasons, M.D.    Dg Chest Port 1 View  09/30/2011  *RADIOLOGY REPORT*  Clinical Data: Altered mental status  PORTABLE CHEST - 1 VIEW  Comparison:  07/12/2011  Findings: Mild patchy left lower lobe opacity, likely atelectasis, pneumonia not excluded.  Stable elevation of the left hemidiaphragm.  Right lung is essentially clear.  No pleural effusion or pneumothorax.  Cardiomediastinal silhouette is within normal limits.  IMPRESSION: Mild patchy left lower lobe opacity, likely atelectasis, pneumonia not excluded.   Original Report Authenticated By: Charline Bills, M.D.  Review of Systems  Constitutional: Negative.   HENT: Negative.   Eyes: Negative.   Respiratory: Negative.   Cardiovascular: Negative.   Gastrointestinal: Negative.   Genitourinary: Negative.   Musculoskeletal:       Weakness and pain in the right lower extremity. Also has swelling and pain of the distal half of left leg.  Skin: Negative.   Neurological: Positive for focal weakness (rigth leg.).  Endo/Heme/Allergies: Negative.   Psychiatric/Behavioral: Negative.     Blood pressure 120/82, pulse 62, temperature 97.9 F (36.6 C), resp. rate 21, SpO2 98.00%. Physical Exam  Constitutional: He is oriented to person, place, and time. He appears well-developed and well-nourished. No distress.  HENT:  Head: Normocephalic and atraumatic.  Right Ear: External ear normal.  Nose: Nose normal.  Mouth/Throat: Oropharynx is clear and moist. No oropharyngeal exudate.  Eyes: Conjunctivae are normal. Pupils are equal, round, and reactive to light. Right eye exhibits no discharge. Left eye exhibits no discharge. No scleral icterus.  Neck: Normal range of motion. Neck supple.  Cardiovascular: Normal rate and regular rhythm.   Respiratory: Effort normal and breath sounds normal. No respiratory distress. He has no wheezes. He has no rales.  GI: Soft. Bowel sounds are normal. He exhibits no distension. There is no tenderness. There is no rebound.  Musculoskeletal: He exhibits edema (edema in the distal part of both lower extremities.) and tenderness (tenderness left lower  extremity.).       Pulses are intact.  Neurological: He is alert and oriented to person, place, and time.       Mild weakness in lower extremity. Reflexes are intact. No facial asymmetry. Tongue is midline.  Skin: He is not diaphoretic.     Assessment/Plan #1. Right lower extremity weakness with pain and elevated CK levels - at this time his symptoms are concerning for myopathy/myositis. Check MRI of his lower leg and may require muscle biopsy. Check ANA sedimentation rate and carnitine levels. We'll also get Doppler to rule out DVT. I think his right lower extremity weakness is more local muscular problem rather than CVA. Will also get MRI brain. Until then patient will be on neurochecks. May need rheumatology consult or opinion in a.m. At this I don't think patient has compartment syndrome. #2. Rhabdomyolysis - see #1. Aggressively hydrate check CK levels in a.m. UA is pending. Closely follow metabolic panel for renal insufficiency. #3. Anemia - patient states he has had a colonoscopy 8 months ago which was normal. Follow CBC and also anemia panel. #4. COPD and ongoing tobacco abuse - presently not wheezing. Strongly advised to quit smoking. #5. Possible infiltrates in the chest x-ray - I have placed patient on Avelox for now. Recheck chest x-ray in a.m. and if there is no definite infiltrates may discontinue antibiotics. #6. History of lung cancer status post resection in 2008. #7. Chronic pain on multiple pain relief medications - closely observe. #8. Mildly elevated LFTs probably from rhabdomyolysis.   CODE STATUS - full code.  Eduard Clos. 09/30/2011, 10:12 PM

## 2011-10-01 DIAGNOSIS — Z79899 Other long term (current) drug therapy: Secondary | ICD-10-CM

## 2011-10-01 DIAGNOSIS — F1310 Sedative, hypnotic or anxiolytic abuse, uncomplicated: Secondary | ICD-10-CM

## 2011-10-01 DIAGNOSIS — F1110 Opioid abuse, uncomplicated: Secondary | ICD-10-CM | POA: Diagnosis present

## 2011-10-01 DIAGNOSIS — J449 Chronic obstructive pulmonary disease, unspecified: Secondary | ICD-10-CM

## 2011-10-01 DIAGNOSIS — M79609 Pain in unspecified limb: Secondary | ICD-10-CM

## 2011-10-01 LAB — LIPID PANEL
LDL Cholesterol: 70 mg/dL (ref 0–99)
Total CHOL/HDL Ratio: 4.9 RATIO
VLDL: 38 mg/dL (ref 0–40)

## 2011-10-01 LAB — COMPREHENSIVE METABOLIC PANEL
Alkaline Phosphatase: 90 U/L (ref 39–117)
BUN: 12 mg/dL (ref 6–23)
Creatinine, Ser: 0.79 mg/dL (ref 0.50–1.35)
GFR calc Af Amer: >90 mL/min (ref >90)
Glucose, Bld: 138 mg/dL — ABNORMAL HIGH (ref 70–99)
Potassium: 3.7 mEq/L (ref 3.5–5.1)
Total Bilirubin: 0.1 mg/dL — ABNORMAL LOW (ref 0.3–1.2)
Total Protein: 5.2 g/dL — ABNORMAL LOW (ref 6.0–8.3)

## 2011-10-01 LAB — CBC WITH DIFFERENTIAL/PLATELET
Eosinophils Absolute: 0.4 10*3/uL (ref 0.0–0.7)
Eosinophils Relative: 7 % — ABNORMAL HIGH (ref 0–5)
Hemoglobin: 9.3 g/dL — ABNORMAL LOW (ref 13.0–17.0)
Lymphs Abs: 2.1 10*3/uL (ref 0.7–4.0)
MCH: 32.5 pg (ref 26.0–34.0)
MCV: 97.6 fL (ref 78.0–100.0)
Monocytes Relative: 11 % (ref 3–12)
RBC: 2.86 MIL/uL — ABNORMAL LOW (ref 4.22–5.81)

## 2011-10-01 LAB — TSH: TSH: 0.952 u[IU]/mL (ref 0.350–4.500)

## 2011-10-01 LAB — CK: Total CK: 9198 U/L — ABNORMAL HIGH (ref 7–232)

## 2011-10-01 LAB — IRON AND TIBC
Saturation Ratios: 8 % — ABNORMAL LOW (ref 20–55)
TIBC: 273 ug/dL (ref 215–435)
UIBC: 250 ug/dL (ref 125–400)

## 2011-10-01 LAB — FERRITIN: Ferritin: 100 ng/mL (ref 22–322)

## 2011-10-01 MED ORDER — BIOTENE DRY MOUTH MT LIQD
15.0000 mL | Freq: Two times a day (BID) | OROMUCOSAL | Status: DC
  Administered 2011-10-01: 15 mL via OROMUCOSAL

## 2011-10-01 MED ORDER — LORAZEPAM 2 MG/ML IJ SOLN: 1.0000 mg | INTRAMUSCULAR | Status: DC | PRN

## 2011-10-01 MED ORDER — SODIUM CHLORIDE 0.9 % IV BOLUS (SEPSIS)
500.0000 mL | Freq: Once | INTRAVENOUS | Status: AC
  Administered 2011-10-01: 500 mL via INTRAVENOUS

## 2011-10-01 MED ORDER — ALBUTEROL SULFATE (5 MG/ML) 0.5% IN NEBU: 2.5000 mg | INHALATION_SOLUTION | RESPIRATORY_TRACT | Status: DC | PRN

## 2011-10-01 NOTE — Progress Notes (Signed)
Clarified with Dr. Rito Ehrlich that we are not ruling out stroke. Weakness is from taking too much pain medication. Neuro checks and MRI dc'd. Emelda Brothers RN

## 2011-10-01 NOTE — Progress Notes (Signed)
CSW attempted to assess patient for current substance abuse including alcohol, pain medicines, benzodiazepenes, and marijuana. CSW introduced self and csw role. Pt gave csw permission to introduce topic, pt paused and stared, then stated, "Well that's the end of the conversation." in reference to pt current substance abuse. Pt did not deny or confirm substance abuse. Pt refused to discuss further. CSW offered resources, and pt replied, "honey please give those to someone else who might want to use them." Pt thanked csw for concern however pt declined any and all conversation with csw at this time. CSW unable to complete SBIRT. Marland KitchenNo further Clinical Social Work needs, signing off.   Catha Gosselin, Theresia Majors  [phone] .10/01/2011 1459pm

## 2011-10-01 NOTE — Progress Notes (Signed)
Notified MD of BP after bolus. Advised to go ahead and give Opana this am and continue to monitor bp.

## 2011-10-01 NOTE — Progress Notes (Signed)
Pt began getting clothes on, would not stay in bed, told me his nurse to get out of his room, threatened to hurt a nurse tech if she touched him. Pt was witnessed falling in floor by housekeeping, pt got self back in bed and continued to try to get up without help. Notified Dr. Rito Ehrlich of fall and pt wanting to leave AMA. Spoke with pts wife about pt leaving with out MD order. Wife stated she was coming to pick up pt. Pt educated about risk of leaving pt signed AMA form. Escorted to car via wheelchair. Emelda Brothers RN

## 2011-10-01 NOTE — Progress Notes (Signed)
  Echocardiogram 2D Echocardiogram has been performed.  Surena Welge 10/01/2011, 11:16 AM

## 2011-10-01 NOTE — Discharge Summary (Signed)
Physician Discharge Summary  Joseph Torres UXL:[id number] DOB: 1956-01-13 DOA: 09/30/2011  PCP: Julieanne Manson, MD  Admit date: 09/30/2011 Discharge date: 10/01/2011  Recommendations for Outpatient Follow-up:  1. Patient is leaving AGAINST MEDICAL ADVICE. I would've preferred for him to stay an additional day.  Discharge Diagnoses:  Principal Problem:  *Weakness of right leg Active Problems:  TOBACCO ABUSE  COPD (chronic obstructive pulmonary disease)  Rhabdomyolysis  Narcotic abuse   Discharge Condition: Improved since yesterday, but the patient is leaving AGAINST MEDICAL ADVICE with muscle enzyme still elevated  Diet recommendation: Regular  Filed Weights   09/30/11 2214  Weight: 68.675 kg (151 lb 6.4 oz)    History of present illness:  Patient is a 56 year old white male with past oral history COPD and tobacco abuse who presented with left leg pain and was found to have rhabdomyolysis. His CPK level was 17,000. This is the patient's second incident in 3 months. He was started on IV fluids and admitted to the hospitalist service for further workup.  Hospital Course:  Rhabdomyolysis: By hospital day 2, the patient's CPK level was down to the 6000. The patient was alert awake and appropriate. His renal function fortunately remained normal. There is some significant concern given this was the second time this was happening about the possibility of some sort of metabolic or paraneoplastic condition causing this. However when I went to evaluate the patient and spoke to him and his family about what rhabdomyolysis was, isolated examples where someone is unconscious and is immobile for an extended period of time muscle enzyme breakdown can occur. The patient's wife gave me a concern look and I further interrogated the patient about periods of being unconscious. He denied any alcohol use but stated at times that he takes his Opana and it knocks him out. The patient's behavior was quite  suspicious because when I asked who prescribes his pain medications he was evasive and became upset when his wife told me that he is followed at the [facility] pain clinic. Given this, I have a strong suspicion that the patient is abusing his narcotics and taking large amounts at a time which is leading to episodes of being unconscious and immobile which is secondarily causing his rhabdomyolysis. The plan was for the patient to stay in the hospital for one more day with IV fluids and likely be discharged tomorrow. The patient however tried to stand on his own today and fell over. When nursing came in, the patient got up and stated that he was leaving AGAINST MEDICAL ADVICE. We advised against this and is concerned about his kidney function but the patient insisted and because if he is of sound mind, we could not keep him against his will.  Narcotic abuse: See above. I'm going to communicate this to the [facility] pain clinic because the patient is clearly endangering himself and taking his narcotic medication improperly.  COPD: Stable.  Procedures:  None  Consultations:  None  Discharge Exam: Filed Vitals:   10/01/11 0405 10/01/11 0521 10/01/11 0600 10/01/11 1200  BP: 85/68 95/62 104/66 99/65  Pulse: 58 59 67 66  Temp: 97.5 F (36.4 C)     Resp:      Height:      Weight:      SpO2: 100%  99% 99%    General: Alert and oriented x3, no acute distress, fatigued, looks older than stated age Cardiovascular: Regular rate and rhythm, S1-S2 Respiratory: Clear to auscultation bilaterally Abdomen soft, nontender, nondistended, positive bowel  sounds Extremities: No clubbing or cyanosis, trace pitting edema, left leg is slightly sore  Discharge Instructions   Medication List  As of 10/01/2011  5:46 PM   ASK your doctor about these medications         albuterol (2.5 MG/3ML) 0.083% nebulizer solution   Commonly known as: PROVENTIL   Take 2.5 mg by nebulization every 6 (six) hours as  needed. FOR SHORTNESS OF BREATH      albuterol 108 (90 BASE) MCG/ACT inhaler   Commonly known as: PROVENTIL HFA;VENTOLIN HFA   Inhale 2 puffs into the lungs every 6 (six) hours as needed. FOR SHORTNESS OF BREATH      buPROPion 300 MG 24 hr tablet   Commonly known as: WELLBUTRIN XL   Take 300 mg by mouth daily.      clonazePAM 2 MG tablet   Commonly known as: KLONOPIN   Take 2 mg by mouth 2 (two) times daily.      oxymorphone 10 MG tablet   Commonly known as: OPANA   Take 10 mg by mouth 3 (three) times daily. Also takes opana 40mg  per family member      oxymorphone 40 MG 12 hr tablet   Commonly known as: OPANA ER   Take 40 mg by mouth every 8 (eight) hours.      pregabalin 200 MG capsule   Commonly known as: LYRICA   Take 200 mg by mouth 4 (four) times daily.      Tamsulosin HCl 0.4 MG Caps   Commonly known as: FLOMAX   Take 0.4 mg by mouth daily.      zolpidem 10 MG tablet   Commonly known as: AMBIEN   Take 10 mg by mouth at bedtime as needed. For sleep.              The results of significant diagnostics from this hospitalization (including imaging, microbiology, ancillary and laboratory) are listed below for reference.    Significant Diagnostic Studies: Ct Head Wo Contrast  09/30/2011   IMPRESSION: 1.  No acute intracranial abnormalities. 2.  Mild cerebral and cerebellar atrophy with mild chronic microvascular ischemic changes of the white matter, as above. 3.  Mild mucosal thickening throughout the ethmoid sinuses bilaterally, without air fluid levels or other acute features.   Original Report Authenticated By: Florencia Reasons, M.D.    Dg Chest Port 1 View  09/30/2011  .  IMPRESSION: Mild patchy left lower lobe opacity, likely atelectasis, pneumonia not excluded.   Original Report Authenticated By: Charline Bills, M.D.     Microbiology: No results found for this or any previous visit (from the past 240 hour(s)).   Labs: Basic Metabolic Panel:  Lab  10/01/11 0540 09/30/11 2248 09/30/11 1649 09/30/11 1550  NA 144 -- 144 140  K 3.7 -- 4.1 4.1  CL 112 -- 108 107  CO2 27 -- -- 26  GLUCOSE 138* -- 91 90  BUN 12 -- 15 15  CREATININE 0.79 0.81 1.00 0.95  CALCIUM 8.2* -- -- 9.1  MG -- 1.9 -- --  PHOS -- 3.0 -- --   Liver Function Tests:  Lab 10/01/11 0540 09/30/11 1550  AST 157* 176*  ALT 59* 58*  ALKPHOS 90 99  BILITOT 0.1* 0.1*  PROT 5.2* 6.7  ALBUMIN 2.6* 3.5    Lab 09/30/11 1803  AMMONIA 33   CBC:  Lab 10/01/11 0540 09/30/11 2248 09/30/11 1649 09/30/11 1550  WBC 6.5 8.1 -- 9.0  NEUTROABS 3.2 -- --  5.1  HGB 9.3* 10.5* 11.9* 10.9*  HCT 27.9* 31.4* 35.0* 33.5*  MCV 97.6 96.9 -- 97.7  PLT 152 160 -- 172   Cardiac Enzymes:  Lab 10/01/11 0540 09/30/11 2248 09/30/11 1803  CKTOTAL 9198* -- 65784*  CKMB -- -- --  Coralyn Mark -- -- --  TROPONINI -- <0.30 --    Time coordinating discharge plus evaluation today: 35 minutes minutes  Signed:  Hollice Espy  [facility] 10/01/2011, 5:46 PM

## 2011-10-01 NOTE — Progress Notes (Signed)
Pt sbp 80's. Pt sitting on side of bed asymptomatic. MD notified and new orders received. Will continue to monitor.

## 2011-10-01 NOTE — Progress Notes (Signed)
*  PRELIMINARY RESULTS* Vascular Ultrasound Lower extremity venous duplex has been completed.  Preliminary findings: bilaterally no evidence of DVT. Baker's cyst on the left consistent with findings from study in 06/2011.     Farrel Demark, RDMS, RVT  10/01/2011, 10:59 AM

## 2011-10-01 NOTE — Care Management Note (Unsigned)
    Page 1 of 1   10/01/2011     4:19:15 PM   CARE MANAGEMENT NOTE 10/01/2011  Patient:  Joseph Torres, Joseph Torres   Account Number:  [id number]  Date Initiated:  10/01/2011  Documentation initiated by:  GRAVES-BIGELOW,Reagyn Facemire  Subjective/Objective Assessment:   Pt admitted with weakness Rhabdomyolysis -Aggressively hydrating and will check CK levels  and monitor renal function.     Action/Plan:   Cm will continue to monitor for disposition needs.   Anticipated DC Date:  10/05/2011   Anticipated DC Plan:  HOME/SELF CARE      DC Planning Services  CM consult      Choice offered to / List presented to:             Status of service:  In process, will continue to follow Medicare Important Message given?   (If response is "NO", the following Medicare IM given date fields will be blank) Date Medicare IM given:   Date Additional Medicare IM given:    Discharge Disposition:    Per UR Regulation:  Reviewed for med. necessity/level of care/duration of stay  If discussed at Long Length of Stay Meetings, dates discussed:    Comments:

## 2011-10-04 LAB — CARNITINE / ACYLCARNITINE PROFILE, BLD
Carnitine, Free: 33 umol/L — ABNORMAL LOW (ref 35–67)
Carnitine, Total: 37 umol/L — ABNORMAL LOW (ref 42.0–81.0)

## 2011-10-26 ENCOUNTER — Ambulatory Visit (INDEPENDENT_AMBULATORY_CARE_PROVIDER_SITE_OTHER): Payer: Medicare Other | Admitting: Thoracic Surgery (Cardiothoracic Vascular Surgery)

## 2011-10-26 ENCOUNTER — Encounter: Payer: Self-pay | Admitting: Thoracic Surgery (Cardiothoracic Vascular Surgery)

## 2011-10-26 ENCOUNTER — Ambulatory Visit
Admission: RE | Admit: 2011-10-26 | Discharge: 2011-10-26 | Disposition: A | Discharge status: Home / Self Care | Payer: Medicare Other | Source: Ambulatory Visit | Attending: Thoracic Surgery (Cardiothoracic Vascular Surgery) | Admitting: Thoracic Surgery (Cardiothoracic Vascular Surgery)

## 2011-10-26 VITALS — BP 91/66 | HR 84 | Resp 16 | Ht 69.0 in | Wt 140.0 lb

## 2011-10-26 DIAGNOSIS — Z85118 Personal history of other malignant neoplasm of bronchus and lung: Secondary | ICD-10-CM

## 2011-10-26 DIAGNOSIS — Z09 Encounter for follow-up examination after completed treatment for conditions other than malignant neoplasm: Secondary | ICD-10-CM

## 2011-10-26 DIAGNOSIS — C3490 Malignant neoplasm of unspecified part of unspecified bronchus or lung: Secondary | ICD-10-CM

## 2011-10-26 NOTE — Patient Instructions (Signed)
Return in one year with Ct of chest

## 2011-10-26 NOTE — Progress Notes (Signed)
HPI:  [name] returns for a scheduled followup visit. He had missed his appointment 6 months ago. 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 He has a history of heavy tobacco abuse, up to 5 packs a day one time. He says that he is now down to smoking 2 or 3 cigarettes a day. He has occasional episodes of wheezing and has run out of his albuterol inhaler. Otherwise his breathing has been stable. He continues to have chronic pain and remains on Opana for that. He has a chronic cough which is unchanged. No hemoptysis. He complains of swelling and tenderness of his nipples he says he was told that was due to hormones.  Past Medical History  Diagnosis Date  . Asthma   . COPD (chronic obstructive pulmonary disease)   . S/P lobectomy of lung left  . Chronic pain   . Chronic anxiety   . Hx: recurrent pneumonia     WITH RESPIRATORY FAILURE, ASSOCIATED WITH SEPSIS IN 2009,   . 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 Pleural effusion, left   . Pericardial effusion   . Lung cancer     STAGE I A, LEFT UPPER LOBE NODULE      Current Outpatient Prescriptions  Medication Sig Dispense Refill  . albuterol (PROVENTIL HFA;VENTOLIN HFA) 108 (90 BASE) MCG/ACT inhaler Inhale 2 puffs into the lungs every 6 (six) hours as needed. FOR SHORTNESS OF BREATH      . albuterol (PROVENTIL) (2.5 MG/3ML) 0.083% nebulizer solution Take 2.5 mg by nebulization every 6 (six) hours as needed. FOR SHORTNESS OF BREATH      . buPROPion (WELLBUTRIN XL) 300 MG 24 hr tablet Take 300 mg by mouth daily.       . clonazePAM (KLONOPIN) 2 MG tablet Take 2 mg by mouth 2 (two) times daily.      Marland Kitchen oxymorphone (OPANA ER) 40 MG 12 hr tablet Take 40 mg by mouth every 8 (eight) hours.      Marland Kitchen oxymorphone (OPANA) 10 MG tablet Take 10 mg by mouth 3 (three) times daily. Also takes opana 40mg  per family member      . pregabalin (LYRICA)  200 MG capsule Take 200 mg by mouth 4 (four) times daily.      . Tamsulosin HCl (FLOMAX) 0.4 MG CAPS Take 0.4 mg by mouth daily.        Marland Kitchen zolpidem (AMBIEN) 10 MG tablet Take 10 mg by mouth at bedtime as needed. For sleep.        Physical Exam BP 91/66  Pulse 84  Resp 16  Ht 5\' 9"  (1.753 m)  Wt 140 lb (63.504 kg)  BMI 20.67 kg/m2  SpO2 92% Gen. 56 year old male in no acute distress No cervical, supraclavicular or axillary adenopathy Lungs diminished breath sounds bilaterally, no rales or wheezes Cardiac regular rate and rhythm normal S1 and S2  Diagnostic Tests: CT of chest 10/26/11  CT CHEST WITHOUT CONTRAST  Technique: Multidetector CT imaging of the chest was performed  following the standard protocol without IV contrast.  Comparison: CT chest of 11/10/2010 and chest x-ray of 09/30/2011  Findings: Changes of centrilobular emphysema are noted particularly  the upper lung fields. The tiny poorly defined nodule in the  periphery the right upper lung field is stable. No new pulmonary  nodule is seen. No effusion is noted. Linear scarring  at the left  lung base is stable.  On soft tissue window images, the thyroid gland is unremarkable.  On this unenhanced study, there is some probable scarring in the  anterior mediastinal fat. No mediastinal or hilar adenopathy is  seen. There is more prominent retroareolar fibroglandular tissue  bilaterally than was noted previously, which may indicate bilateral  gynecomastia. Enlargement of the left adrenal gland is stable. No  bony abnormality is seen.  IMPRESSION:  1. No evidence of recurrence of lung carcinoma.  2. Stable poorly defined tiny nodule in the right upper lung field  described previously.  3. Emphysema.  4. More prominent retroareolar fibroglandular tissue bilaterally.  Possible bilateral gynecomastia.   Impression: 56 year old male with a history of heavy tobacco abuse and COPD who is now 5 years out from a left upper  lobectomy for stage IA non-small cell carcinoma. He has no evidence of recurrent disease and at this point is considered cured.  He does, however, remain at risk for development of a new primary cancer and should continue to be screened for that. I recommended that he participate in the lung cancer screening program with annual low dose chest CT to rule out new nodules.  I congratulated him on his marked reduction in smoking, but stress the need for him to take the final step and quit smoking altogether. He understands about the importance of this, but is reluctant to sig he will do so.  Plan: I will see him back in one year with a low-dose CT of the chest to rule out new lung nodules.

## 2012-06-04 ENCOUNTER — Emergency Department (HOSPITAL_COMMUNITY): Payer: Medicare Other

## 2012-06-04 ENCOUNTER — Emergency Department (HOSPITAL_COMMUNITY)
Admission: EM | Admit: 2012-06-04 | Discharge: 2012-06-04 | Disposition: A | Discharge status: Home / Self Care | Payer: Medicare Other | Attending: Emergency Medicine | Admitting: Emergency Medicine

## 2012-06-04 ENCOUNTER — Encounter (HOSPITAL_COMMUNITY): Payer: Self-pay | Admitting: *Deleted

## 2012-06-04 DIAGNOSIS — Z79899 Other long term (current) drug therapy: Secondary | ICD-10-CM | POA: Insufficient documentation

## 2012-06-04 DIAGNOSIS — F411 Generalized anxiety disorder: Secondary | ICD-10-CM | POA: Insufficient documentation

## 2012-06-04 DIAGNOSIS — G8929 Other chronic pain: Secondary | ICD-10-CM | POA: Insufficient documentation

## 2012-06-04 DIAGNOSIS — F17200 Nicotine dependence, unspecified, uncomplicated: Secondary | ICD-10-CM | POA: Insufficient documentation

## 2012-06-04 DIAGNOSIS — Z8701 Personal history of pneumonia (recurrent): Secondary | ICD-10-CM | POA: Insufficient documentation

## 2012-06-04 DIAGNOSIS — J449 Chronic obstructive pulmonary disease, unspecified: Secondary | ICD-10-CM | POA: Insufficient documentation

## 2012-06-04 DIAGNOSIS — J4489 Other specified chronic obstructive pulmonary disease: Secondary | ICD-10-CM | POA: Insufficient documentation

## 2012-06-04 DIAGNOSIS — Z85118 Personal history of other malignant neoplasm of bronchus and lung: Secondary | ICD-10-CM | POA: Insufficient documentation

## 2012-06-04 DIAGNOSIS — Z8679 Personal history of other diseases of the circulatory system: Secondary | ICD-10-CM | POA: Insufficient documentation

## 2012-06-04 DIAGNOSIS — Z880 Allergy status to penicillin: Secondary | ICD-10-CM | POA: Insufficient documentation

## 2012-06-04 DIAGNOSIS — Z9889 Other specified postprocedural states: Secondary | ICD-10-CM | POA: Insufficient documentation

## 2012-06-04 MED ORDER — HYDROMORPHONE HCL PF 1 MG/ML IJ SOLN
1.0000 mg | Freq: Once | INTRAMUSCULAR | Status: AC
  Administered 2012-06-04: 1 mg via INTRAVENOUS
  Filled 2012-06-04: qty 1

## 2012-06-04 MED ORDER — ONDANSETRON HCL 4 MG/2ML IJ SOLN
4.0000 mg | Freq: Once | INTRAMUSCULAR | Status: AC
  Administered 2012-06-04: 4 mg via INTRAVENOUS
  Filled 2012-06-04: qty 2

## 2012-06-04 NOTE — ED Provider Notes (Signed)
History     CSN: [id number]  Arrival date & time 06/04/12  0031   First MD Initiated Contact with Patient 06/04/12 0133      Chief Complaint  Patient presents with  . Shoulder Pain    (Consider location/radiation/quality/duration/timing/severity/associated sxs/prior treatment) HPI Hx per PT - chronic neck pain takes opana daily and gets cervical injections - is scheduled for injection this week.  Has been having L shoulder pain x 3 weeks, sharp pain worse with movement - no known alleviating factors. PT has h/o lung cancer s/p resection. No SOB. No CP and no radiation of shoulder pain. No trauma, no rash, no swelling, no F/C. PT states he always has pain all over but this is new and more severe pain.   Past Medical History  Diagnosis Date  . Asthma   . COPD (chronic obstructive pulmonary disease)   . S/P lobectomy of lung left  . Chronic pain   . Chronic anxiety   . Hx: recurrent pneumonia     WITH RESPIRATORY FAILURE, ASSOCIATED WITH SEPSIS IN 2009,   . 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 Pleural effusion, left   . Pericardial effusion   . Lung cancer     STAGE I A, LEFT UPPER LOBE NODULE     Past Surgical History  Procedure Laterality Date  . Cervi al spine surgery  1997  . Rotator cuff surgery rt side  1996  . Left elbow surgery      FOR MEDIAN NERVE PALSY  . Left upper lobectomy  08/25/2006    [facility]    History reviewed. No pertinent family history.  History  Substance Use Topics  . Smoking status: Current Some Day Smoker -- 0.25 packs/day for 40 years    Types: Cigarettes  . Smokeless tobacco: Never Used  . Alcohol Use: No      Review of Systems  Constitutional: Negative for fever and chills.  HENT: Negative for neck pain and neck stiffness.   Eyes: Negative for pain.  Respiratory: Negative for shortness of breath and wheezing.   Cardiovascular: Negative for chest pain, palpitations and leg swelling.   Gastrointestinal: Negative for vomiting and abdominal pain.  Genitourinary: Negative for dysuria.  Musculoskeletal: Negative for back pain.  Skin: Negative for rash and wound.  Neurological: Negative for headaches.  All other systems reviewed and are negative.    Allergies  Aspirin; Codeine; Penicillins; and Tylenol  Home Medications   Current Outpatient Rx  Name  Route  Sig  Dispense  Refill  . buPROPion (WELLBUTRIN XL) 300 MG 24 hr tablet   Oral   Take 300 mg by mouth daily.          . clonazePAM (KLONOPIN) 2 MG tablet   Oral   Take 2 mg by mouth 2 (two) times daily.         . finasteride (PROSCAR) 5 MG tablet   Oral   Take 5 mg by mouth daily.         . methocarbamol (ROBAXIN) 750 MG tablet   Oral   Take 750 mg by mouth 4 (four) times daily.         Marland Kitchen oxymorphone (OPANA ER) 40 MG 12 hr tablet   Oral   Take 40 mg by mouth every 8 (eight) hours.         Marland Kitchen oxymorphone (OPANA) 10 MG tablet   Oral   Take 10 mg  by mouth 3 (three) times daily. Also takes opana 40mg  per family member         . pregabalin (LYRICA) 200 MG capsule   Oral   Take 200 mg by mouth 4 (four) times daily.         . Tamsulosin HCl (FLOMAX) 0.4 MG CAPS   Oral   Take 0.4 mg by mouth daily.           . traZODone (DESYREL) 100 MG tablet   Oral   Take 100 mg by mouth at bedtime. sleep         . albuterol (PROVENTIL HFA;VENTOLIN HFA) 108 (90 BASE) MCG/ACT inhaler   Inhalation   Inhale 2 puffs into the lungs every 6 (six) hours as needed (sob). FOR SHORTNESS OF BREATH         . albuterol (PROVENTIL) (2.5 MG/3ML) 0.083% nebulizer solution   Nebulization   Take 2.5 mg by nebulization every 6 (six) hours as needed. FOR SHORTNESS OF BREATH           BP 153/96  Pulse 81  Temp(Src) 97.7 F (36.5 C) (Oral)  Resp 21  Ht 5\' 9"  (1.753 m)  SpO2 98%  Physical Exam  Constitutional: He is oriented to person, place, and time. He appears well-developed and well-nourished.   HENT:  Head: Normocephalic and atraumatic.  Eyes: EOM are normal. Pupils are equal, round, and reactive to light.  Neck: No tracheal deviation present.  no midline cervical deformity  Cardiovascular: Normal rate, regular rhythm and intact distal pulses.   Pulmonary/Chest: Effort normal and breath sounds normal. No stridor. No respiratory distress. He exhibits no tenderness.  Abdominal: Soft. Bowel sounds are normal. He exhibits no distension.  Musculoskeletal:  L shoulder tenderness over anterior aspect with some dec ROM 2/2 pain. No swelling, erythema or inc warmth to touch. No deformity. Distal N/V intact.   Lymphadenopathy:    He has no cervical adenopathy.  Neurological: He is alert and oriented to person, place, and time.  Gait intact. No deficits.  Skin: Skin is warm and dry.    ED Course  Procedures (including critical care time)  Dg Shoulder Left  06/04/2012  *RADIOLOGY REPORT*  Clinical Data: Worsening left shoulder pain.  LEFT SHOULDER - 2+ VIEW  Comparison: None.  Findings: There is no acute bony or joint abnormality.  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  Imaged left lung and ribs are unremarkable.  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  No acute finding.   Original Report Authenticated By: Holley Dexter, M.D.      Date: 06/04/2012  Rate: 66  Rhythm: normal sinus rhythm  QRS Axis: normal  Intervals: normal  ST/T Wave abnormalities: nonspecific ST changes  Conduction Disutrbances:none  Narrative Interpretation:   Old EKG Reviewed: none available  Xray as above  IV Dilaudid  3:50 AM recheck pain improving - has Opana at home, will continue, sling offered for comfort. Plan f/u outpatient as scheduled. Further Ortho referral provided by request  MDM  L shoulder pain x 3 weeks unchnaged, chronic, has arthritis on xray and normal ECG  Improved with IV narcotics  VS and nursing notes  reviewed        Sunnie Nielsen, MD 06/04/12 0400

## 2012-06-04 NOTE — ED Notes (Signed)
Pt c/o L shoulder pain uncontrolled by rx'd pain medications.

## 2012-06-04 NOTE — ED Notes (Signed)
Patient is alert and oriented x3.  He was given DC instructions and follow up visit instructions.  Patient gave verbal understanding.  He was DC ambulatory under his own power to home.  V/S stable.  He was not showing any signs of distress on DC 

## 2012-10-20 ENCOUNTER — Other Ambulatory Visit: Payer: Self-pay | Admitting: *Deleted

## 2012-11-14 ENCOUNTER — Other Ambulatory Visit: Payer: Medicare Other

## 2012-11-14 ENCOUNTER — Ambulatory Visit: Payer: Medicare Other | Admitting: Thoracic Surgery (Cardiothoracic Vascular Surgery)
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
## 2013-08-21 ENCOUNTER — Telehealth: Payer: Self-pay

## 2013-08-21 NOTE — Telephone Encounter (Signed)
Patient died per Joseph Torres

## 2013-08-23 IMAGING — CR DG CHEST 1V PORT
1 series · 1 of 1 positions shown · non-contrast
Comparison: 07/12/2011

CLINICAL DATA: Altered mental status

PORTABLE CHEST - 1 VIEW

[AP]
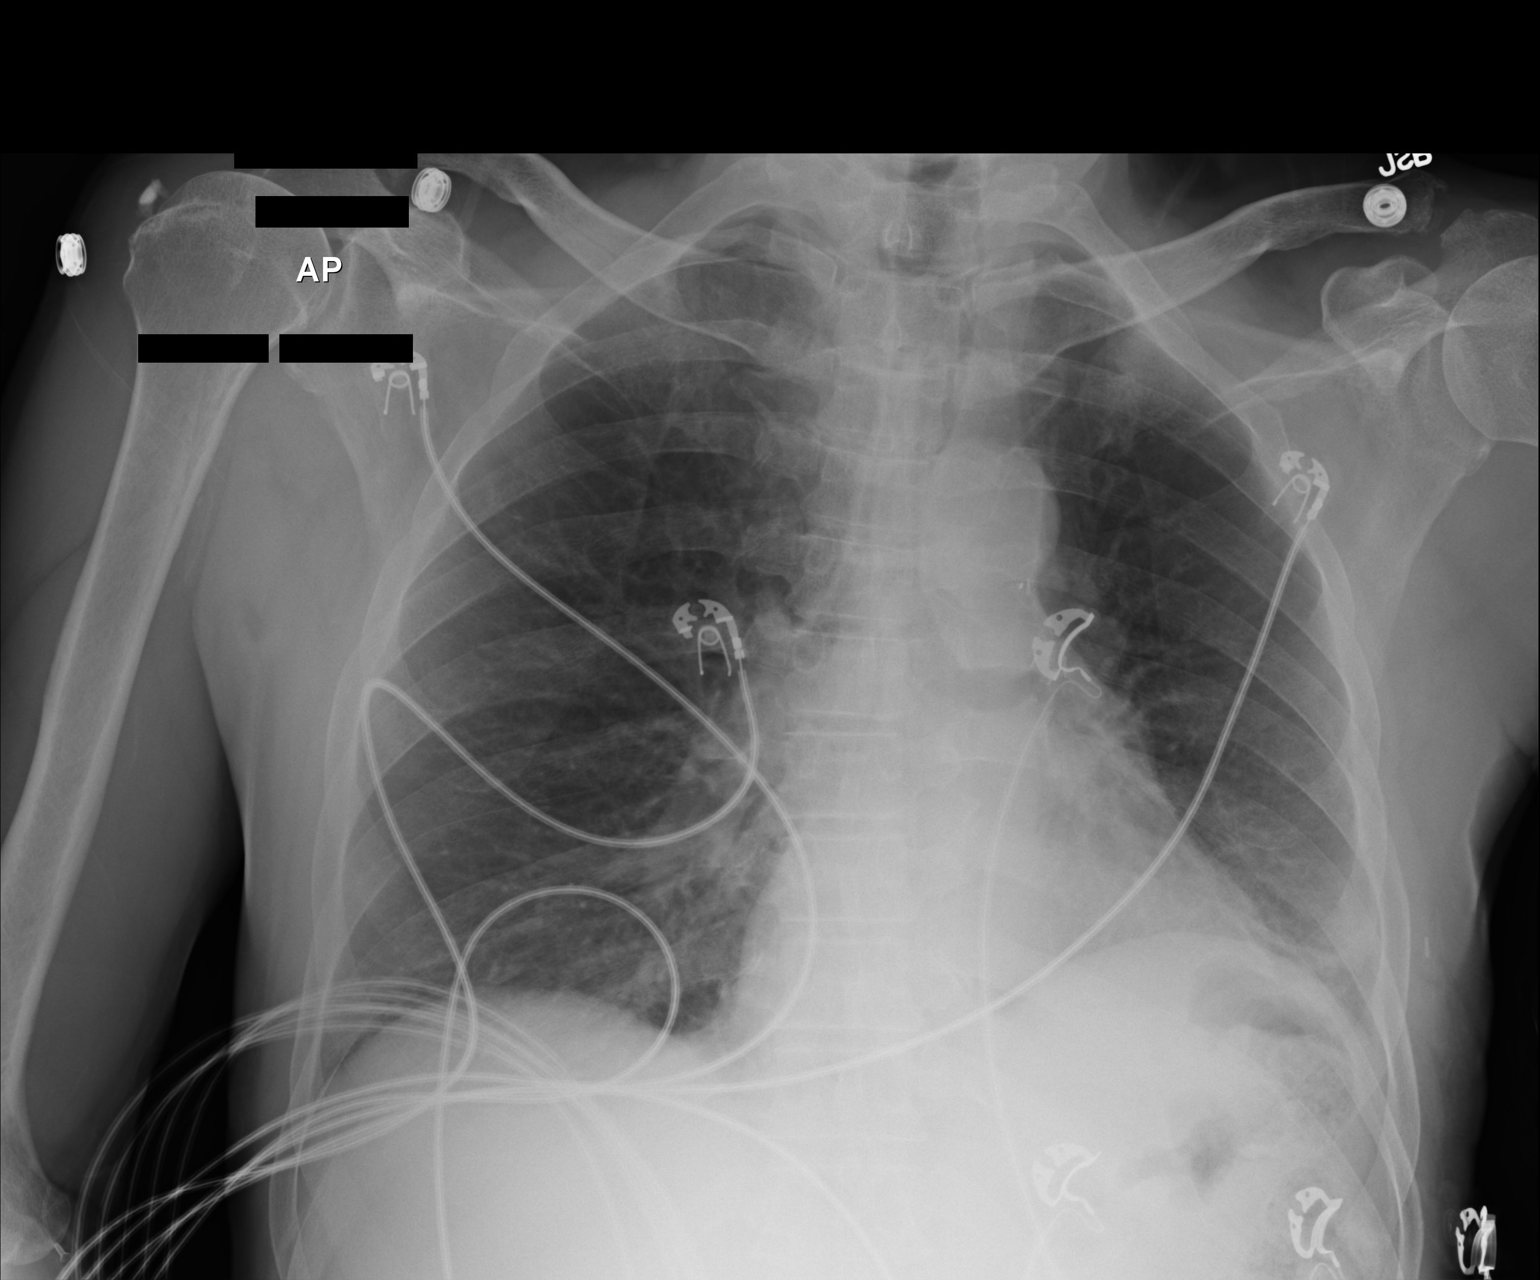

[1 of 1 positions shown; findings below may reference images not displayed]

FINDINGS: Mild patchy left lower lobe opacity, likely atelectasis,
pneumonia not excluded.

Stable elevation of the left hemidiaphragm.  Right lung is
essentially clear.  No pleural effusion or pneumothorax.

Cardiomediastinal silhouette is within normal limits.
IMPRESSION: Mild patchy left lower lobe opacity, likely atelectasis, pneumonia
not excluded.

## 2013-08-25 DEATH — deceased
